# Patient Record
Sex: Female | Born: 1945 | Race: Black or African American | Hispanic: No | Marital: Married | Smoking: Never smoker
Health system: Southern US, Community
[De-identification: ages and names within clinical notes are randomized; demographics above are authoritative.]

## PROBLEM LIST (undated history)

## (undated) DIAGNOSIS — J309 Allergic rhinitis, unspecified: Secondary | ICD-10-CM

## (undated) DIAGNOSIS — J45909 Unspecified asthma, uncomplicated: Secondary | ICD-10-CM

## (undated) DIAGNOSIS — E785 Hyperlipidemia, unspecified: Secondary | ICD-10-CM

## (undated) DIAGNOSIS — I10 Essential (primary) hypertension: Secondary | ICD-10-CM

## (undated) HISTORY — PX: HERNIA REPAIR: SHX51

---

## 2020-05-25 ENCOUNTER — Other Ambulatory Visit: Payer: Self-pay

## 2020-05-25 ENCOUNTER — Other Ambulatory Visit: Payer: Medicare Other

## 2020-05-25 DIAGNOSIS — Z20822 Contact with and (suspected) exposure to covid-19: Secondary | ICD-10-CM

## 2020-05-27 LAB — SARS-COV-2, NAA 2 DAY TAT

## 2020-05-27 LAB — NOVEL CORONAVIRUS, NAA: SARS-CoV-2, NAA: NOT DETECTED

## 2021-02-16 ENCOUNTER — Ambulatory Visit (INDEPENDENT_AMBULATORY_CARE_PROVIDER_SITE_OTHER): Payer: BC Managed Care – PPO

## 2021-02-16 ENCOUNTER — Other Ambulatory Visit: Payer: Self-pay

## 2021-02-16 ENCOUNTER — Encounter: Payer: Self-pay | Admitting: Orthopaedic Surgery

## 2021-02-16 ENCOUNTER — Ambulatory Visit (INDEPENDENT_AMBULATORY_CARE_PROVIDER_SITE_OTHER): Payer: BC Managed Care – PPO | Admitting: Orthopaedic Surgery

## 2021-02-16 DIAGNOSIS — M25511 Pain in right shoulder: Secondary | ICD-10-CM | POA: Insufficient documentation

## 2021-02-16 DIAGNOSIS — G8929 Other chronic pain: Secondary | ICD-10-CM | POA: Diagnosis not present

## 2021-02-16 MED ORDER — BUPIVACAINE HCL 0.25 % IJ SOLN
2.0000 mL | INTRAMUSCULAR | Status: AC | PRN
  Administered 2021-02-16: 2 mL via INTRA_ARTICULAR

## 2021-02-16 MED ORDER — LIDOCAINE HCL 2 % IJ SOLN
2.0000 mL | INTRAMUSCULAR | Status: AC | PRN
  Administered 2021-02-16: 2 mL

## 2021-02-16 NOTE — Progress Notes (Signed)
Office Visit Note   Patient: Misty Hughes           Date of Birth: 1946-05-23           MRN: [id number] Visit Date: 02/16/2021              Requested by: No referring provider defined for this encounter. PCP: No primary care provider on file.   Assessment & Plan: Visit Diagnoses:  1. Chronic right shoulder pain     Plan: Ms. Richens experienced insidious onset of right shoulder pain approximately a month ago.  No history of injury or trauma.  Pain is experience along the proximal arm and not associated with numbness or tingling or any weakness.  She seems to have some difficulty at night and even some trouble reaching overhead.  She is never had any prior problems or prior surgery.  She does have evidence of impingement.  I have given her several choices including a subacromial cortisone injection.  She would like to try that before further diagnostic testing.  Depending upon her response she will let me know over the next 3 to 4 weeks how she did and then we may want to consider an MRI scan as she could easily have a rotator cuff tear  Follow-Up Instructions: Return if symptoms worsen or fail to improve.   Orders:  Orders Placed This Encounter  Procedures   XR Shoulder Right   No orders of the defined types were placed in this encounter.     Procedures: Large Joint Inj: R subacromial bursa on 02/16/2021 12:17 PM Indications: pain and diagnostic evaluation Details: 25 G 1.5 in needle, anterolateral approach  Arthrogram: No  Medications: 2 mL lidocaine 2 %; 2 mL bupivacaine 0.25 %  12 mg betamethasone injected into subacromial space right shoulder with Marcaine and Xylocaine Consent was given by the patient. Immediately prior to procedure a time out was called to verify the correct patient, procedure, equipment, support staff and site/side marked as required. Patient was prepped and draped in the usual sterile fashion.      Clinical Data: No additional  findings.   Subjective: Chief Complaint  Patient presents with   Right Shoulder - Pain  Patient presents today for right shoulder pain. She said that it has been hurting for one month. No known injury. She has pain in her proximal arm. She has no numbness, tingling, or weakness. She said that she has no difficulty with movement of her arm. Her pain is the worst at night when she lays down. She takes Tylenol as needed. She is right hand dominant. No previous treatment or surgery on this shoulder.  HPI  Review of Systems   Objective: Vital Signs: There were no vitals taken for this visit.  Physical Exam Constitutional:      Appearance: She is well-developed.  Eyes:     Pupils: Pupils are equal, round, and reactive to light.  Pulmonary:     Effort: Pulmonary effort is normal.  Skin:    General: Skin is warm and dry.  Neurological:     Mental Status: She is alert and oriented to person, place, and time.  Psychiatric:        Behavior: Behavior normal.    Ortho Exam right shoulder with full overhead motion with minimal discomfort.  There is some pain with impingement testing and with empty can testing.  Negative speeds sign.  Skin intact.  Some tenderness along the anterior subacromial region but no crepitation.  Biceps intact.  Good grip and release  Specialty Comments:  No specialty comments available.  Imaging: XR Shoulder Right  Result Date: 02/16/2021 Films of the right shoulder obtained in several projections.  There may be a very small inferior humeral head spur and some sclerosis about the greater tuberosity.  The joint space is well-maintained.  No ectopic calcification or acute change.  Some bulbous formation of the distal clavicle with AC joint arthritis.  Acromion appears to be flat.  Films may be consistent with some very early arthritis    PMFS History: Patient Active Problem List   Diagnosis Date Noted   Pain in right shoulder 02/16/2021   History reviewed. No  pertinent past medical history.  History reviewed. No pertinent family history.  History reviewed. No pertinent surgical history. Social History   Occupational History   Not on file  Tobacco Use   Smoking status: Not on file   Smokeless tobacco: Not on file  Substance and Sexual Activity   Alcohol use: Not on file   Drug use: Not on file   Sexual activity: Not on file

## 2021-07-20 ENCOUNTER — Ambulatory Visit: Payer: BC Managed Care – PPO

## 2021-07-20 ENCOUNTER — Ambulatory Visit (INDEPENDENT_AMBULATORY_CARE_PROVIDER_SITE_OTHER): Payer: BC Managed Care – PPO | Admitting: Orthopaedic Surgery

## 2021-07-20 ENCOUNTER — Other Ambulatory Visit: Payer: Self-pay

## 2021-07-20 ENCOUNTER — Encounter: Payer: Self-pay | Admitting: Orthopaedic Surgery

## 2021-07-20 VITALS — Ht 61.0 in | Wt 150.0 lb

## 2021-07-20 DIAGNOSIS — G8929 Other chronic pain: Secondary | ICD-10-CM

## 2021-07-20 DIAGNOSIS — M1711 Unilateral primary osteoarthritis, right knee: Secondary | ICD-10-CM

## 2021-07-20 DIAGNOSIS — M25561 Pain in right knee: Secondary | ICD-10-CM

## 2021-07-20 MED ORDER — LIDOCAINE HCL 1 % IJ SOLN
2.0000 mL | INTRAMUSCULAR | Status: AC | PRN
  Administered 2021-07-20: 2 mL

## 2021-07-20 MED ORDER — BUPIVACAINE HCL 0.25 % IJ SOLN
2.0000 mL | INTRAMUSCULAR | Status: AC | PRN
  Administered 2021-07-20: 2 mL via INTRA_ARTICULAR

## 2021-07-20 NOTE — Progress Notes (Signed)
Office Visit Note   Patient: Misty Hughes           Date of Birth: 15-May-1946           MRN: [id number] Visit Date: 07/20/2021              Requested by: No referring provider defined for this encounter. PCP: No primary care provider on file.   Assessment & Plan: Visit Diagnoses:  1. Chronic pain of right knee   2. Unilateral primary osteoarthritis, right knee     Plan: Misty Hughes has end-stage osteoarthritis of her left knee with predominant changes in the lateral compartment.  There is bone-on-bone with large osteophytes and increased valgus.  Long discussion regarding the x-ray findings and her diagnosis.  We also discussed at length different treatment options.  She is not interested in pursuing a knee replacement.  Going to inject the knee with betamethasone and suggest that she use a over-the-counter rub knee support.  She can also still try Voltaren gel or some over-the-counter anti-inflammatory medicines.  Would like to see her back in a month for reevaluation  Follow-Up Instructions: Return if symptoms worsen or fail to improve.   Orders:  Orders Placed This Encounter  Procedures   XR KNEE 3 VIEW RIGHT   No orders of the defined types were placed in this encounter.     Procedures: Large Joint Inj: R knee on 07/20/2021 2:14 PM Indications: pain and diagnostic evaluation Details: 25 G 1.5 in needle, anteromedial approach  Arthrogram: No  Medications: 2 mL lidocaine 1 %; 2 mL bupivacaine 0.25 %  12 mg betamethasone injected with Xylocaine and Marcaine the lateral compartment right knee Procedure, treatment alternatives, risks and benefits explained, specific risks discussed. Consent was given by the patient. Immediately prior to procedure a time out was called to verify the correct patient, procedure, equipment, support staff and site/side marked as required. Patient was prepped and draped in the usual sterile fashion.      Clinical Data: No additional  findings.   Subjective: Chief Complaint  Patient presents with   Right Knee - Pain  Patient presents today for right knee pain. She said that it has hurt for years. No known injury. She said that over time the pain is getting worse. Most of her pain is lateral and anterior. She said that it swells and gives way. No grinding. She does not take anything for pain.   HPI  Review of Systems   Objective: Vital Signs: Ht 5\' 1"  (1.549 m)    Wt 150 lb (68 kg)    BMI 28.34 kg/m   Physical Exam Constitutional:      Appearance: She is well-developed.  Pulmonary:     Effort: Pulmonary effort is normal.  Skin:    General: Skin is warm and dry.  Neurological:     Mental Status: She is alert and oriented to person, place, and time.  Psychiatric:        Behavior: Behavior normal.    Ortho Exam awake alert and oriented x3.  Comfortable sitting.  Right knee with increased valgus particularly with weightbearing.  No obvious effusion.  Does have large knees.  Mostly lateral joint pain but mild.  No pain over the femoral condyle tibial plateau.  No pain medially.  Some patella crepitation but did have full extension and flex to least 105 degrees without instability.  Might have some popliteal fullness but no particular pain  Specialty Comments:  No specialty comments  available.  Imaging: XR KNEE 3 VIEW RIGHT  Result Date: 07/20/2021 Films of the right knee were obtained in 3 projections standing.  There is advanced osteoarthritis with significant changes in the lateral compartment.  There is bone-on-bone and probably 5 to 6 degrees of valgus.  In the lateral compartment is also subchondral sclerosis and peripheral osteophytes.  I did not see any ectopic calcification but did note some osteophytes in the medial compartment and considerably about the patellofemoral joint.  There were large osteophytes more lateral than medial.  Films are consistent with end-stage osteoarthritis without acute change     PMFS History: Patient Active Problem List   Diagnosis Date Noted   Unilateral primary osteoarthritis, right knee 07/20/2021   Pain in right shoulder 02/16/2021   History reviewed. No pertinent past medical history.  History reviewed. No pertinent family history.  History reviewed. No pertinent surgical history. Social History   Occupational History   Not on file  Tobacco Use   Smoking status: Not on file   Smokeless tobacco: Not on file  Substance and Sexual Activity   Alcohol use: Not on file   Drug use: Not on file   Sexual activity: Not on file

## 2021-08-17 ENCOUNTER — Ambulatory Visit (INDEPENDENT_AMBULATORY_CARE_PROVIDER_SITE_OTHER): Payer: Medicare Other | Admitting: Orthopaedic Surgery

## 2021-08-17 ENCOUNTER — Encounter: Payer: Self-pay | Admitting: Orthopaedic Surgery

## 2021-08-17 ENCOUNTER — Other Ambulatory Visit: Payer: Self-pay

## 2021-08-17 VITALS — Ht 61.0 in | Wt 150.0 lb

## 2021-08-17 DIAGNOSIS — M1711 Unilateral primary osteoarthritis, right knee: Secondary | ICD-10-CM

## 2021-08-17 NOTE — Progress Notes (Signed)
Office Visit Note   Patient: Misty Hughes           Date of Birth: Oct 26, 1945           MRN: [id number] Visit Date: 08/17/2021              Requested by: No referring provider defined for this encounter. PCP: No primary care provider on file.   Assessment & Plan: Visit Diagnoses:  1. Unilateral primary osteoarthritis, right knee     Plan: Misty Hughes has end-stage osteoarthritis of her right knee she is done very well with a recent cortisone injection and would like to proceed with viscosupplementation.  We will pre-CERT and have her return in the next several weeks. This patient is diagnosed with osteoarthritis of the knee(s).    Radiographs show evidence of joint space narrowing, osteophytes, subchondral sclerosis and/or subchondral cysts.  This patient has knee pain which interferes with functional and activities of daily living.    This patient has experienced inadequate response, adverse effects and/or intolerance with conservative treatments such as acetaminophen, NSAIDS, topical creams, physical therapy or regular exercise, knee bracing and/or weight loss.   This patient has experienced inadequate response or has a contraindication to intra articular steroid injections for at least 3 months.   This patient is not scheduled to have a total knee replacement within 6 months of starting treatment with viscosupplementation.   Follow-Up Instructions: Return pre cert visco.   Orders:  No orders of the defined types were placed in this encounter.  No orders of the defined types were placed in this encounter.     Procedures: No procedures performed   Clinical Data: No additional findings.   Subjective: Chief Complaint  Patient presents with   Right Knee - Follow-up  Patient presents today for follow up on her right knee. She received a right knee injection on 07/20/2021. She said that it helped. She did start to experience pain two days ago. Bracing helps. She is  not taking anything for pain.  HPI  Review of Systems   Objective: Vital Signs: Ht 5\' 1"  (1.549 m)    Wt 150 lb (68 kg)    BMI 28.34 kg/m   Physical Exam Constitutional:      Appearance: She is well-developed.  Pulmonary:     Effort: Pulmonary effort is normal.  Skin:    General: Skin is warm and dry.  Neurological:     Mental Status: She is alert and oriented to person, place, and time.  Psychiatric:        Behavior: Behavior normal.    Ortho Exam awake alert and oriented x3 comfortable sitting.  May be small effusion right knee.  Lacks just a few degrees to full extension of flexed at least 100 degrees without instability.  Had little bit of medial lateral joint pain but definitely better after the cortisone injection several weeks ago  Specialty Comments:  No specialty comments available.  Imaging: No results found.   PMFS History: Patient Active Problem List   Diagnosis Date Noted   Unilateral primary osteoarthritis, right knee 07/20/2021   Pain in right shoulder 02/16/2021   History reviewed. No pertinent past medical history.  History reviewed. No pertinent family history.  History reviewed. No pertinent surgical history. Social History   Occupational History   Not on file  Tobacco Use   Smoking status: Not on file   Smokeless tobacco: Not on file  Substance and Sexual Activity   Alcohol use:  Not on file   Drug use: Not on file   Sexual activity: Not on file

## 2021-08-31 ENCOUNTER — Ambulatory Visit (INDEPENDENT_AMBULATORY_CARE_PROVIDER_SITE_OTHER): Payer: BC Managed Care – PPO | Admitting: Orthopaedic Surgery

## 2021-08-31 ENCOUNTER — Other Ambulatory Visit: Payer: Self-pay

## 2021-08-31 ENCOUNTER — Encounter: Payer: Self-pay | Admitting: Orthopaedic Surgery

## 2021-08-31 DIAGNOSIS — M1711 Unilateral primary osteoarthritis, right knee: Secondary | ICD-10-CM

## 2021-08-31 MED ORDER — HYLAN G-F 20 16 MG/2ML IX SOSY
16.0000 mg | PREFILLED_SYRINGE | INTRA_ARTICULAR | Status: AC | PRN
  Administered 2021-08-31: 16 mg via INTRA_ARTICULAR

## 2021-08-31 NOTE — Progress Notes (Signed)
° °  Office Visit Note   Patient: Misty Hughes           Date of Birth: 01/07/1946           MRN: [id number] Visit Date: 08/31/2021              Requested by: No referring provider defined for this encounter. PCP: No primary care provider on file.   Assessment & Plan: Visit Diagnoses:  1. Unilateral primary osteoarthritis, right knee     Plan: Misty Hughes had her first Synvisc injection into the medial compartment right knee today without problem.  Will be seen weekly for the next 2 weeks to complete the series of 3  Follow-Up Instructions: Return in about 1 week (around 09/07/2021).   Orders:  No orders of the defined types were placed in this encounter.  No orders of the defined types were placed in this encounter.     Procedures: Large Joint Inj: R knee on 08/31/2021 1:39 PM Indications: pain and joint swelling Details: 25 G 1.5 in needle, anteromedial approach  Arthrogram: No  Medications: 16 mg Hylan 16 MG/2ML Outcome: tolerated well, no immediate complications Procedure, treatment alternatives, risks and benefits explained, specific risks discussed. Consent was given by the patient. Immediately prior to procedure a time out was called to verify the correct patient, procedure, equipment, support staff and site/side marked as required. Patient was prepped and draped in the usual sterile fashion.      Clinical Data: No additional findings.   Subjective: Chief Complaint  Patient presents with   Right Knee - Follow-up    Synvisc  Patient presents today for the first Synvisc injection into her right knee.  HPI  Review of Systems   Objective: Vital Signs: There were no vitals taken for this visit.  Physical Exam  Ortho Exam right knee was not hot red or swollen.  Minimal medial joint pain.  No instability.  Specialty Comments:  No specialty comments available.  Imaging: No results found.   PMFS History: Patient Active Problem List   Diagnosis Date  Noted   Unilateral primary osteoarthritis, right knee 07/20/2021   Pain in right shoulder 02/16/2021   History reviewed. No pertinent past medical history.  History reviewed. No pertinent family history.  History reviewed. No pertinent surgical history. Social History   Occupational History   Not on file  Tobacco Use   Smoking status: Not on file   Smokeless tobacco: Not on file  Substance and Sexual Activity   Alcohol use: Not on file   Drug use: Not on file   Sexual activity: Not on file

## 2021-09-08 ENCOUNTER — Ambulatory Visit (INDEPENDENT_AMBULATORY_CARE_PROVIDER_SITE_OTHER): Payer: BC Managed Care – PPO | Admitting: Orthopaedic Surgery

## 2021-09-08 ENCOUNTER — Other Ambulatory Visit: Payer: Self-pay

## 2021-09-08 VITALS — Ht 61.0 in | Wt 150.0 lb

## 2021-09-08 DIAGNOSIS — M1711 Unilateral primary osteoarthritis, right knee: Secondary | ICD-10-CM

## 2021-09-08 MED ORDER — HYLAN G-F 20 16 MG/2ML IX SOSY
16.0000 mg | PREFILLED_SYRINGE | INTRA_ARTICULAR | Status: AC | PRN
  Administered 2021-09-08: 16 mg via INTRA_ARTICULAR

## 2021-09-08 MED ORDER — LIDOCAINE HCL 1 % IJ SOLN
0.5000 mL | INTRAMUSCULAR | Status: AC | PRN
  Administered 2021-09-08: .5 mL

## 2021-09-08 NOTE — Progress Notes (Signed)
? ?  Office Visit Note ?  ?Patient: Misty Hughes           ?Date of Birth: 06/23/1946           ?MRN: [id number] ?Visit Date: 09/08/2021 ?             ?Requested by: No referring provider defined for this encounter. ?PCP: No primary care provider on file. ? ? ?Assessment & Plan: ?Visit Diagnoses:  ?1. Unilateral primary osteoarthritis, right knee   ? ? ?Plan: Second Synvisc injection performed.  I am out of town next week she will see Dr. Durward Fortes for the third injection of her right knee. ?Follow-Up Instructions: No follow-ups on file.  ? ?Orders:  ?Orders Placed This Encounter  ?Procedures  ? Large Joint Inj  ? ?No orders of the defined types were placed in this encounter. ? ? ? ? Procedures: ?Large Joint Inj: R knee on 09/08/2021 2:21 PM ?Indications: pain and joint swelling ?Details: 22 G 1.5 in needle, anterolateral approach ? ?Arthrogram: No ? ?Medications: 0.5 mL lidocaine 1 %; 16 mg Hylan 16 MG/2ML ?Outcome: tolerated well, no immediate complications ?Procedure, treatment alternatives, risks and benefits explained, specific risks discussed. Consent was given by the patient. Immediately prior to procedure a time out was called to verify the correct patient, procedure, equipment, support staff and site/side marked as required. Patient was prepped and draped in the usual sterile fashion.  ? ? ? ? ?Clinical Data: ?No additional findings. ? ? ?Subjective: ?Chief Complaint  ?Patient presents with  ? Right Knee - Pain  ?  Synvisc injection #2  ? ? ?HPI patient here for right knee second Synvisc injection. ? ?Review of Systems unchanged ? ? ?Objective: ?Vital Signs: Ht 5\' 1"  (1.549 m)   Wt 150 lb (68 kg)   BMI 28.34 kg/m?  ? ?Physical Exam unchanged ? ?Ortho Exam crepitus with knee range of motion.  No abnormality at the previous injection site for first Synvisc injection right knee. ? ?Specialty Comments:  ?No specialty comments available. ? ?Imaging: ?No results found. ? ? ?PMFS History: ?Patient Active Problem  List  ? Diagnosis Date Noted  ? Unilateral primary osteoarthritis, right knee 07/20/2021  ? Pain in right shoulder 02/16/2021  ? ?No past medical history on file.  ?No family history on file.  ?No past surgical history on file. ?Social History  ? ?Occupational History  ? Not on file  ?Tobacco Use  ? Smoking status: Not on file  ? Smokeless tobacco: Not on file  ?Substance and Sexual Activity  ? Alcohol use: Not on file  ? Drug use: Not on file  ? Sexual activity: Not on file  ? ? ? ? ? ? ?

## 2021-09-14 ENCOUNTER — Ambulatory Visit (INDEPENDENT_AMBULATORY_CARE_PROVIDER_SITE_OTHER): Payer: BC Managed Care – PPO | Admitting: Orthopaedic Surgery

## 2021-09-14 ENCOUNTER — Encounter: Payer: Self-pay | Admitting: Orthopaedic Surgery

## 2021-09-14 DIAGNOSIS — M1711 Unilateral primary osteoarthritis, right knee: Secondary | ICD-10-CM | POA: Diagnosis not present

## 2021-09-14 MED ORDER — HYLAN G-F 20 16 MG/2ML IX SOSY
16.0000 mg | PREFILLED_SYRINGE | INTRA_ARTICULAR | Status: AC | PRN
  Administered 2021-09-14: 16 mg via INTRA_ARTICULAR

## 2021-09-14 NOTE — Progress Notes (Signed)
? ?  Office Visit Note ?  ?Patient: Misty Hughes           ?Date of Birth: Nov 22, 1945           ?MRN: [id number] ?Visit Date: 09/14/2021 ?             ?Requested by: No referring provider defined for this encounter. ?PCP: No primary care provider on file. ? ? ?Assessment & Plan: ?Visit Diagnoses:  ?1. Unilateral primary osteoarthritis, right knee   ? ? ?Plan: Third and final Synvisc injection right knee has had an excellent response with the prior 2 injections.  We will plan to see back as needed ? ?Follow-Up Instructions: Return if symptoms worsen or fail to improve.  ? ?Orders:  ?No orders of the defined types were placed in this encounter. ? ?No orders of the defined types were placed in this encounter. ? ? ? ? Procedures: ?Large Joint Inj: L knee on 09/14/2021 1:54 PM ?Indications: pain and joint swelling ?Details: 25 G 1.5 in needle, anteromedial approach ? ?Arthrogram: No ? ?Medications: 16 mg Hylan 16 MG/2ML ?Outcome: tolerated well, no immediate complications ?Procedure, treatment alternatives, risks and benefits explained, specific risks discussed. Consent was given by the patient. Immediately prior to procedure a time out was called to verify the correct patient, procedure, equipment, support staff and site/side marked as required. Patient was prepped and draped in the usual sterile fashion.  ? ? ? ? ?Clinical Data: ?No additional findings. ? ? ?Subjective: ?Chief Complaint  ?Patient presents with  ? Right Knee - Follow-up  ?  Synvisc  ?Patient presents today for the third Synvisc injection in her right knee. ? ?HPI ? ?Review of Systems ? ? ?Objective: ?Vital Signs: There were no vitals taken for this visit. ? ?Physical Exam ? ?Ortho Exam right knee was not hot red warm or swollen and no specific areas of tenderness ? ?Specialty Comments:  ?No specialty comments available. ? ?Imaging: ?No results found. ? ? ?PMFS History: ?Patient Active Problem List  ? Diagnosis Date Noted  ? Unilateral primary  osteoarthritis, right knee 07/20/2021  ? Pain in right shoulder 02/16/2021  ? ?History reviewed. No pertinent past medical history.  ?History reviewed. No pertinent family history.  ?History reviewed. No pertinent surgical history. ?Social History  ? ?Occupational History  ? Not on file  ?Tobacco Use  ? Smoking status: Not on file  ? Smokeless tobacco: Not on file  ?Substance and Sexual Activity  ? Alcohol use: Not on file  ? Drug use: Not on file  ? Sexual activity: Not on file  ? ? ? ? ? ? ?

## 2021-11-20 ENCOUNTER — Observation Stay (HOSPITAL_COMMUNITY)
Admission: EM | Admit: 2021-11-20 | Discharge: 2021-11-21 | Disposition: A | Discharge status: Home / Self Care | Payer: BC Managed Care – PPO | Attending: Family Medicine | Admitting: Family Medicine

## 2021-11-20 ENCOUNTER — Other Ambulatory Visit: Payer: Self-pay

## 2021-11-20 ENCOUNTER — Emergency Department (HOSPITAL_COMMUNITY): Payer: BC Managed Care – PPO

## 2021-11-20 ENCOUNTER — Encounter (HOSPITAL_COMMUNITY): Payer: Self-pay | Admitting: *Deleted

## 2021-11-20 DIAGNOSIS — R2689 Other abnormalities of gait and mobility: Secondary | ICD-10-CM | POA: Diagnosis not present

## 2021-11-20 DIAGNOSIS — R479 Unspecified speech disturbances: Secondary | ICD-10-CM | POA: Diagnosis present

## 2021-11-20 DIAGNOSIS — R471 Dysarthria and anarthria: Secondary | ICD-10-CM

## 2021-11-20 DIAGNOSIS — I10 Essential (primary) hypertension: Secondary | ICD-10-CM | POA: Diagnosis present

## 2021-11-20 DIAGNOSIS — R4701 Aphasia: Secondary | ICD-10-CM | POA: Diagnosis present

## 2021-11-20 DIAGNOSIS — I63512 Cerebral infarction due to unspecified occlusion or stenosis of left middle cerebral artery: Principal | ICD-10-CM | POA: Insufficient documentation

## 2021-11-20 DIAGNOSIS — G459 Transient cerebral ischemic attack, unspecified: Secondary | ICD-10-CM

## 2021-11-20 DIAGNOSIS — I639 Cerebral infarction, unspecified: Secondary | ICD-10-CM | POA: Diagnosis present

## 2021-11-20 DIAGNOSIS — Z20822 Contact with and (suspected) exposure to covid-19: Secondary | ICD-10-CM | POA: Insufficient documentation

## 2021-11-20 HISTORY — DX: Allergic rhinitis, unspecified: J30.9

## 2021-11-20 HISTORY — DX: Essential (primary) hypertension: I10

## 2021-11-20 HISTORY — DX: Hyperlipidemia, unspecified: E78.5

## 2021-11-20 LAB — COMPREHENSIVE METABOLIC PANEL
ALT: 17 U/L (ref 0–44)
AST: 19 U/L (ref 15–41)
Albumin: 4.3 g/dL (ref 3.5–5.0)
Alkaline Phosphatase: 60 U/L (ref 38–126)
Anion gap: 6 (ref 5–15)
BUN: 24 mg/dL — ABNORMAL HIGH (ref 8–23)
CO2: 31 mmol/L (ref 22–32)
Calcium: 9.9 mg/dL (ref 8.9–10.3)
Chloride: 105 mmol/L (ref 98–111)
Creatinine, Ser: 1.02 mg/dL — ABNORMAL HIGH (ref 0.44–1.00)
GFR, Estimated: 57 mL/min — ABNORMAL LOW (ref >60)
Glucose, Bld: 165 mg/dL — ABNORMAL HIGH (ref 70–99)
Potassium: 4.1 mmol/L (ref 3.5–5.1)
Sodium: 142 mmol/L (ref 135–145)
Total Bilirubin: 1 mg/dL (ref 0.3–1.2)
Total Protein: 7.6 g/dL (ref 6.5–8.1)

## 2021-11-20 LAB — CBC
HCT: 35.8 % — ABNORMAL LOW (ref 36.0–46.0)
Hemoglobin: 12.2 g/dL (ref 12.0–15.0)
MCH: 31.4 pg (ref 26.0–34.0)
MCHC: 34.1 g/dL (ref 30.0–36.0)
MCV: 92.3 fL (ref 80.0–100.0)
Platelets: 312 10*3/uL (ref 150–400)
RBC: 3.88 MIL/uL (ref 3.87–5.11)
RDW: 13.4 % (ref 11.5–15.5)
WBC: 5.4 10*3/uL (ref 4.0–10.5)
nRBC: 0 % (ref 0.0–0.2)

## 2021-11-20 LAB — I-STAT CHEM 8, ED
BUN: 24 mg/dL — ABNORMAL HIGH (ref 8–23)
Calcium, Ion: 1.25 mmol/L (ref 1.15–1.40)
Chloride: 103 mmol/L (ref 98–111)
Creatinine, Ser: 0.9 mg/dL (ref 0.44–1.00)
Glucose, Bld: 165 mg/dL — ABNORMAL HIGH (ref 70–99)
HCT: 39 % (ref 36.0–46.0)
Hemoglobin: 13.3 g/dL (ref 12.0–15.0)
Potassium: 4.1 mmol/L (ref 3.5–5.1)
Sodium: 142 mmol/L (ref 135–145)
TCO2: 29 mmol/L (ref 22–32)

## 2021-11-20 LAB — RESP PANEL BY RT-PCR (FLU A&B, COVID) ARPGX2
Influenza A by PCR: NEGATIVE
Influenza B by PCR: NEGATIVE
SARS Coronavirus 2 by RT PCR: NEGATIVE

## 2021-11-20 LAB — PROTIME-INR
INR: 1.1 (ref 0.8–1.2)
Prothrombin Time: 13.7 seconds (ref 11.4–15.2)

## 2021-11-20 LAB — DIFFERENTIAL
Abs Immature Granulocytes: 0.01 10*3/uL (ref 0.00–0.07)
Basophils Absolute: 0 10*3/uL (ref 0.0–0.1)
Basophils Relative: 0 %
Eosinophils Absolute: 0 10*3/uL (ref 0.0–0.5)
Eosinophils Relative: 1 %
Immature Granulocytes: 0 %
Lymphocytes Relative: 17 %
Lymphs Abs: 0.9 10*3/uL (ref 0.7–4.0)
Monocytes Absolute: 0.4 10*3/uL (ref 0.1–1.0)
Monocytes Relative: 7 %
Neutro Abs: 4.1 10*3/uL (ref 1.7–7.7)
Neutrophils Relative %: 75 %

## 2021-11-20 LAB — RAPID URINE DRUG SCREEN, HOSP PERFORMED
Amphetamines: NOT DETECTED
Barbiturates: NOT DETECTED
Benzodiazepines: NOT DETECTED
Cocaine: NOT DETECTED
Opiates: NOT DETECTED
Tetrahydrocannabinol: NOT DETECTED

## 2021-11-20 LAB — URINALYSIS, ROUTINE W REFLEX MICROSCOPIC
Bilirubin Urine: NEGATIVE
Glucose, UA: NEGATIVE mg/dL
Hgb urine dipstick: NEGATIVE
Ketones, ur: NEGATIVE mg/dL
Leukocytes,Ua: NEGATIVE
Nitrite: NEGATIVE
Protein, ur: NEGATIVE mg/dL
Specific Gravity, Urine: 1.03 (ref 1.005–1.030)
pH: 8 (ref 5.0–8.0)

## 2021-11-20 LAB — APTT: aPTT: 31 seconds (ref 24–36)

## 2021-11-20 LAB — ETHANOL: Alcohol, Ethyl (B): <10 mg/dL (ref <10)

## 2021-11-20 MED ORDER — ACETAMINOPHEN 650 MG RE SUPP: 650.0000 mg | RECTAL | Status: DC | PRN

## 2021-11-20 MED ORDER — ACETAMINOPHEN 325 MG PO TABS: 650.0000 mg | ORAL_TABLET | ORAL | Status: DC | PRN

## 2021-11-20 MED ORDER — IOHEXOL 300 MG/ML  SOLN: 75.0000 mL | Freq: Once | INTRAMUSCULAR | Status: DC | PRN

## 2021-11-20 MED ORDER — CLOPIDOGREL BISULFATE 75 MG PO TABS
300.0000 mg | ORAL_TABLET | Freq: Once | ORAL | Status: AC
  Administered 2021-11-20: 300 mg via ORAL
  Filled 2021-11-20: qty 4

## 2021-11-20 MED ORDER — STROKE: EARLY STAGES OF RECOVERY BOOK
Freq: Once | Status: AC
  Filled 2021-11-20: qty 1

## 2021-11-20 MED ORDER — ASPIRIN 81 MG PO CHEW
324.0000 mg | CHEWABLE_TABLET | Freq: Once | ORAL | Status: AC
  Administered 2021-11-20: 324 mg via ORAL
  Filled 2021-11-20: qty 4

## 2021-11-20 MED ORDER — ATORVASTATIN CALCIUM 40 MG PO TABS
40.0000 mg | ORAL_TABLET | Freq: Every day | ORAL | Status: DC
  Administered 2021-11-21: 40 mg via ORAL
  Filled 2021-11-20: qty 1

## 2021-11-20 MED ORDER — CLOPIDOGREL BISULFATE 75 MG PO TABS
75.0000 mg | ORAL_TABLET | Freq: Every day | ORAL | Status: DC
  Administered 2021-11-21: 75 mg via ORAL
  Filled 2021-11-20 (×2): qty 1

## 2021-11-20 MED ORDER — ACETAMINOPHEN 160 MG/5ML PO SOLN: 650.0000 mg | ORAL | Status: DC | PRN

## 2021-11-20 MED ORDER — ASPIRIN EC 81 MG PO TBEC
81.0000 mg | DELAYED_RELEASE_TABLET | Freq: Every day | ORAL | Status: DC
  Administered 2021-11-21: 81 mg via ORAL
  Filled 2021-11-20: qty 1

## 2021-11-20 MED ORDER — SENNOSIDES-DOCUSATE SODIUM 8.6-50 MG PO TABS: 1.0000 | ORAL_TABLET | Freq: Every evening | ORAL | Status: DC | PRN

## 2021-11-20 MED ORDER — IOHEXOL 350 MG/ML SOLN
75.0000 mL | Freq: Once | INTRAVENOUS | Status: AC | PRN
  Administered 2021-11-20: 75 mL via INTRAVENOUS

## 2021-11-20 MED ORDER — LORAZEPAM 2 MG/ML IJ SOLN: 0.5000 mg | Freq: Once | INTRAMUSCULAR | Status: DC

## 2021-11-20 NOTE — ED Notes (Signed)
Ambulatory to restroom with no assist.  ?

## 2021-11-20 NOTE — ED Provider Notes (Signed)
?[place] ?Provider Note ? ? ?CSN: [id number] ?Arrival date & time: 11/20/21  1438 ? ?  ? ?History ? ?Chief Complaint  ?Patient presents with  ? Aphasia  ? ? ?Misty Hughes is a 76 y.o. female. ? ?HPI ?Patient presents 2 episodes today of difficulty speaking.  Reportedly words were right in her head but she could not get them out.  May have been the wrong words but also potentially slurred.  States she was feeling fine besides it.  Reported lasted a little over 1/2-hour the first episode it may be similar amount the second time.  No having fevers.  No headache.  No confusion.  States she was little anxious about the events.  At this time feels back to normal although potentially feels a little generally weak.  Denies history of high blood pressure but also does appear that she may be on Dyazide. ?  ?History reviewed. No pertinent past medical history. ? ?Home Medications ?Prior to Admission medications   ?Medication Sig Start Date End Date Taking? Authorizing Provider  ?triamterene-hydrochlorothiazide (DYAZIDE) 37.5-25 MG capsule Take 1 capsule by mouth daily as needed. 06/20/21   [provider]  ?   ? ?Allergies    ?Patient has no known allergies.   ? ?Review of Systems   ?Review of Systems  ?Constitutional:  Negative for appetite change.  ?Eyes:  Negative for visual disturbance.  ?Respiratory:  Negative for cough.   ?Gastrointestinal:  Negative for abdominal pain.  ?Neurological:  Positive for speech difficulty.  ? ?Physical Exam ?Updated Vital Signs ?BP 126/71   Pulse 87   Temp 98.5 ?F (36.9 ?C) (Oral)   Resp 18   Ht '5\' 1"'$  (1.549 m)   Wt 61.2 kg   SpO2 99%   BMI 25.51 kg/m?  ?Physical Exam ?Vitals and nursing note reviewed.  ?HENT:  ?   Head: Atraumatic.  ?Eyes:  ?   Pupils: Pupils are equal, round, and reactive to light.  ?Abdominal:  ?   Tenderness: There is abdominal tenderness.  ?Musculoskeletal:  ?   Cervical back: Neck supple.  ?Skin: ?   Capillary Refill: Capillary  refill takes less than 2 seconds.  ?Neurological:  ?   Mental Status: She is alert and oriented to person, place, and time.  ?   Comments: Able to ambulate.  Finger-nose intact.  Face symmetric.  Normal speech.  Does appear little anxious.  Eye movements intact but may have a little nystagmus with gaze to left or right.  ? ? ?ED Results / Procedures / Treatments   ?Labs ?(all labs ordered are listed, but only abnormal results are displayed) ?Labs Reviewed  ?CBC - Abnormal; Notable for the following components:  ?    Result Value  ? HCT 35.8 (*)   ? All other components within normal limits  ?COMPREHENSIVE METABOLIC PANEL - Abnormal; Notable for the following components:  ? Glucose, Bld 165 (*)   ? BUN 24 (*)   ? Creatinine, Ser 1.02 (*)   ? GFR, Estimated 57 (*)   ? All other components within normal limits  ?I-STAT CHEM 8, ED - Abnormal; Notable for the following components:  ? BUN 24 (*)   ? Glucose, Bld 165 (*)   ? All other components within normal limits  ?RESP PANEL BY RT-PCR (FLU A&B, COVID) [id number]  ?PROTIME-INR  ?APTT  ?DIFFERENTIAL  ?ETHANOL  ?RAPID URINE DRUG SCREEN, HOSP PERFORMED  ?URINALYSIS, ROUTINE W REFLEX MICROSCOPIC  ? ? ?EKG ?EKG  Interpretation ? ?Date/Time:  Sunday Nov 20 2021 15:58:07 EDT ?Ventricular Rate:  102 ?PR Interval:    ?QRS Duration: 85 ?QT Interval:  351 ?QTC Calculation: 458 ?R Axis:   62 ?Text Interpretation: Junctional tachycardia vs 1st degree block Borderline low voltage, extremity leads Probable anteroseptal infarct, old Confirmed by Davonna Belling [phone]) on 11/20/2021 4:44:56 PM ? ?Radiology ?CT ANGIO HEAD NECK W WO CM ? ?Result Date: 11/20/2021 ?CLINICAL DATA:  Neuro deficit, acute, stroke suspected. Slurred speech, resolved. EXAM: CT ANGIOGRAPHY HEAD AND NECK TECHNIQUE: Multidetector CT imaging of the head and neck was performed using the standard protocol during bolus administration of intravenous contrast. Multiplanar CT image reconstructions and MIPs were obtained to  evaluate the vascular anatomy. Carotid stenosis measurements (when applicable) are obtained utilizing NASCET criteria, using the distal internal carotid diameter as the denominator. RADIATION DOSE REDUCTION: This exam was performed according to the departmental dose-optimization program which includes automated exposure control, adjustment of the mA and/or kV according to patient size and/or use of iterative reconstruction technique. CONTRAST:  75 mL Omnipaque 350 COMPARISON:  None Available. FINDINGS: CT HEAD FINDINGS Brain: There is no evidence of an acute infarct, intracranial hemorrhage, mass, midline shift, or extra-axial fluid collection. There is a small chronic right parietal cortical infarct. Mild cerebral atrophy is within normal limits for age. Vascular: Reported below. Skull: No fracture or suspicious osseous lesion. Sinuses: Visualized paranasal sinuses and mastoid air cells are clear. Orbits: Unremarkable. Review of the MIP images confirms the above findings CTA NECK FINDINGS Aortic arch: Normal variant aortic arch branching pattern with common origin of the brachiocephalic and left common carotid arteries. Widely patent arch vessel origins. Right carotid system: Patent without evidence stenosis or dissection. Retropharyngeal course of the mid cervical ICA. Left carotid system: Patent without evidence of stenosis or dissection. Vertebral arteries: Patent and codominant without evidence of stenosis or dissection. Skeleton: Mild cervicothoracic disc degeneration. Moderately advanced facet arthrosis at C7-T1 with grade 1 anterolisthesis. Bilateral facet ankylosis from T1-T3. Other neck: Subcentimeter thyroid nodules for which no follow-up imaging is recommended. No evidence of cervical lymphadenopathy. Upper chest: Clear lung apices. Review of the MIP images confirms the above findings CTA HEAD FINDINGS Anterior circulation: The internal carotid arteries are patent from skull base to carotid termini with  mild atherosclerotic plaque bilaterally not resulting in significant stenosis. An infundibulum is noted at the right posterior communicating origin. The A1 and M1 segments are widely patent bilaterally. There is a new proximal to mid left M2 branch occlusion with minimal distal reconstitution. No aneurysm is identified. Posterior circulation: The intracranial vertebral arteries are widely patent to the basilar. Patent right PICA, left AICA, and bilateral SCA origins are identified. The basilar artery is widely patent. There are small right and large left posterior communicating arteries with hypoplasia of the left P1 segment. Both PCAs are patent without evidence of a significant proximal stenosis. No aneurysm is identified. Venous sinuses: Patent. Anatomic variants: Predominantly fetal origin of the left PCA. Review of the MIP images confirms the above findings IMPRESSION: 1. 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 No visible acute infarct or hemorrhage on noncontrast CT. 2. Widely patent cervical carotid and vertebral arteries. 3. Small chronic right parietal cortical infarct. These results were called by telephone at the time of interpretation on 11/20/2021 at 5:03 pm to Dr. Davonna Belling, who verbally acknowledged these results. Electronically Signed   By: Logan Bores M.D.   On: 11/20/2021 17:04   ? ?Procedures ?Procedures  ? ? ?  Medications Ordered in ED ?Medications  ?aspirin chewable tablet 324 mg (has no administration in time range)  ?clopidogrel (PLAVIX) tablet 300 mg (has no administration in time range)  ?iohexol (OMNIPAQUE) 350 MG/ML injection 75 mL (75 mLs Intravenous Contrast Given 11/20/21 1637)  ? ? ?ED Course/ Medical Decision Making/ A&P ?  ?  ABCD2 Score: 3                     ?Medical Decision Making ?Amount and/or Complexity of Data Reviewed ?Labs: ordered. ?Radiology: ordered. ? ?Risk ?OTC drugs. ?Prescription drug management. ? ? ?Patient presented with difficulty speaking.  Had 2 separate  episodes today eachLasting over half hour.  Back to baseline now.  Will reset the clock in case new deficits develop. ? ?Patient still feeling about the same.  Potentially occasionally has a little difficulty wo

## 2021-11-20 NOTE — H&P (Signed)
?History and Physical  ? ? ?Misty Hughes INO:[id number] DOB: 1946/07/04 DOA: 11/20/2021 ? ?PCP: Pcp, No  ? ?Patient coming from: Home ? ?I have personally briefly reviewed patient's old medical records in [place] ? ?Chief Complaint: Speech difficulty  ? ?HPI: Misty Hughes is a 76 y.o. female with no known medical history presented to the ED with complaints of slurred speech.  Patient had 2 episodes of this today.  First episode was about 10:00 in the morning, last episode was about 1 PM today.  Patient was last seen normal at about 6 AM this morning. Patient reports she had towards in her head but she could not get them out, and when she got them out the words were slurred.  Patient's daughter who is not currently at bedside had the slurred speech.  This lasted about half an hour and resolved.  The second episode lasted about the same. ?Spouse and patient's daughterShon Hale at bedside. ?She also reports today at about 1:00, patient was trying to hold her utensils with her right hand but was unable to.  At this time all of the symptoms have mostly resolved.  She is having some occasional delay voicing some words during my evaluation.  Patient's daughter [name] states this is not normal. ? ?Patient is not on any medications. ? ?ED Course: Stable vitals.  Head CT-without acute abnormality.  CTA neck without acute abnormality.  CTA head shows proximal to mid left M2 branch occlusion.  No visible acute infarct or hemorrhage. ? ?[place] neurology consulted, recommended admission to [facility], CTA head findings noted, per notes based on patient's minimal symptoms, she does not meet criteria for intra-arterial thrombectomy.  Obtain CT perfusion if change in mental status. ?If patient remains at [facility] due to bed unavailability, work-up can be completed here, neurology here to follow. ? ?Review of Systems: As per HPI all other systems reviewed and negative. ? ?History reviewed. No pertinent past medical  history. ? ?History reviewed. No pertinent surgical history. ? ? reports that she has never smoked. She has never used smokeless tobacco. She reports that she does not drink alcohol and does not use drugs. ? ?No Known Allergies ? ?Family history of hypertension. ? ?Prior to Admission medications   ?Medication Sig Start Date End Date Taking? Authorizing Provider  ?triamterene-hydrochlorothiazide (DYAZIDE) 37.5-25 MG capsule Take 1 capsule by mouth daily as needed. 06/20/21   [provider]  ? ? ?Physical Exam: ?Vitals:  ? 11/20/21 1456 11/20/21 1600 11/20/21 1630 11/20/21 1700  ?BP: 128/68 118/64 126/71 129/65  ?Pulse: (!) 103 95 87 100  ?Resp: 17 (!) '25 18 13  '$ ?Temp: 98.5 ?F (36.9 ?C)     ?TempSrc: Oral     ?SpO2: 99% 99% 99% 100%  ?Weight: 61.2 kg     ?Height: '5\' 1"'$  (1.549 m)     ? ? ?Constitutional: NAD, calm, comfortable ?Vitals:  ? 11/20/21 1456 11/20/21 1600 11/20/21 1630 11/20/21 1700  ?BP: 128/68 118/64 126/71 129/65  ?Pulse: (!) 103 95 87 100  ?Resp: 17 (!) '25 18 13  '$ ?Temp: 98.5 ?F (36.9 ?C)     ?TempSrc: Oral     ?SpO2: 99% 99% 99% 100%  ?Weight: 61.2 kg     ?Height: '5\' 1"'$  (1.549 m)     ? ?Eyes: PERRL, lids and conjunctivae normal ?ENMT: Mucous membranes are moist.   ?Neck: normal, supple, no masses, no thyromegaly ?Respiratory: clear to auscultation bilaterally, no wheezing, no crackles. Normal respiratory effort. No  accessory muscle use.  ?Cardiovascular: Regular rate and rhythm, no murmurs / rubs / gallops. No extremity edema. 2+ pedal pulses. No carotid bruits.  ?Abdomen: no tenderness, no masses palpated. No hepatosplenomegaly. Bowel sounds positive.  ?Musculoskeletal: no clubbing / cyanosis. No joint deformity upper and lower extremities. Good ROM, no contractures. Normal muscle tone.  ?Skin: no rashes, lesions, ulcers. No induration ?Neurologic:  ?Neurological:  ?   Mental Status: She is alert.  ?   GCS: GCS eye subscore is 4. GCS verbal subscore is 5. GCS motor subscore is 6.  ?    Comments: Mental Status:  ?Alert, oriented, thought content appropriate, able to give a coherent history. Speech fluent with occasional delay in verbalizing some words. Able to follow 2 step commands without difficulty. ?Cranial Nerves:  ?II:  Peripheral visual fields grossly normal, pupils equal, round, reactive to light ?III,IV, VI: ptosis not present, extra-ocular motions intact bilaterally  ?V,VII: smile symmetric, eyebrows raise symmetric, facial light touch sensation equal ?VIII: hearing grossly normal to voice  ?X:   ?XI: bilateral shoulder shrug symmetric and strong ?XII: midline tongue extension without fassiculations ?Motor:  ?Normal tone.  5/5 strength in bilateral upper and lower extremities ?Sensory: Sensation intact to light touch in all extremities.  ?Cerebellar:   ?CV: distal pulses palpable throughout  ?  ?Psychiatric: Normal judgment and insight. Alert and oriented x 3. Normal mood.  ? ?Labs on Admission: I have personally reviewed following labs and imaging studies ? ?CBC: ?Recent Labs  ?Lab 11/20/21 ?1515 11/20/21 ?1548  ?WBC 5.4  --   ?NEUTROABS 4.1  --   ?HGB 12.2 13.3  ?HCT 35.8* 39.0  ?MCV 92.3  --   ?PLT 312  --   ? ?Basic Metabolic Panel: ?Recent Labs  ?Lab 11/20/21 ?1515 11/20/21 ?1548  ?NA 142 142  ?K 4.1 4.1  ?CL 105 103  ?CO2 31  --   ?GLUCOSE 165* 165*  ?BUN 24* 24*  ?CREATININE 1.02* 0.90  ?CALCIUM 9.9  --   ? ?Liver Function Tests: ?Recent Labs  ?Lab 11/20/21 ?1515  ?AST 19  ?ALT 17  ?ALKPHOS 60  ?BILITOT 1.0  ?PROT 7.6  ?ALBUMIN 4.3  ? ?Coagulation Profile: ?Recent Labs  ?Lab 11/20/21 ?1515  ?INR 1.1  ? ? ?Radiological Exams on Admission: ?CT ANGIO HEAD NECK W WO CM ? ?Result Date: 11/20/2021 ?CLINICAL DATA:  Neuro deficit, acute, stroke suspected. Slurred speech, resolved. EXAM: CT ANGIOGRAPHY HEAD AND NECK TECHNIQUE: Multidetector CT imaging of the head and neck was performed using the standard protocol during bolus administration of intravenous contrast. Multiplanar CT image  reconstructions and MIPs were obtained to evaluate the vascular anatomy. Carotid stenosis measurements (when applicable) are obtained utilizing NASCET criteria, using the distal internal carotid diameter as the denominator. RADIATION DOSE REDUCTION: This exam was performed according to the departmental dose-optimization program which includes automated exposure control, adjustment of the mA and/or kV according to patient size and/or use of iterative reconstruction technique. CONTRAST:  75 mL Omnipaque 350 COMPARISON:  None Available. FINDINGS: CT HEAD FINDINGS Brain: There is no evidence of an acute infarct, intracranial hemorrhage, mass, midline shift, or extra-axial fluid collection. There is a small chronic right parietal cortical infarct. Mild cerebral atrophy is within normal limits for age. Vascular: Reported below. Skull: No fracture or suspicious osseous lesion. Sinuses: Visualized paranasal sinuses and mastoid air cells are clear. Orbits: Unremarkable. Review of the MIP images confirms the above findings CTA NECK FINDINGS Aortic arch: Normal variant aortic arch branching  pattern with common origin of the brachiocephalic and left common carotid arteries. Widely patent arch vessel origins. Right carotid system: Patent without evidence stenosis or dissection. Retropharyngeal course of the mid cervical ICA. Left carotid system: Patent without evidence of stenosis or dissection. Vertebral arteries: Patent and codominant without evidence of stenosis or dissection. Skeleton: Mild cervicothoracic disc degeneration. Moderately advanced facet arthrosis at C7-T1 with grade 1 anterolisthesis. Bilateral facet ankylosis from T1-T3. Other neck: Subcentimeter thyroid nodules for which no follow-up imaging is recommended. No evidence of cervical lymphadenopathy. Upper chest: Clear lung apices. Review of the MIP images confirms the above findings CTA HEAD FINDINGS Anterior circulation: The internal carotid arteries are  patent from skull base to carotid termini with mild atherosclerotic plaque bilaterally not resulting in significant stenosis. An infundibulum is noted at the right posterior communicating origin. The A1 and M1 segments are w

## 2021-11-20 NOTE — Assessment & Plan Note (Addendum)
RESOLVED NOW.  BACK TO BASELINE.  ?Secondary to acute stroke  ?Speech abnormality, and transient weakness of right hand.  Head CT without acute abnormality, CTA head and neck obtained, showing proximal to mid left M2 branch occlusion.  She denies prior strokes. ?-Aspirin 325 x1 continue 81 mg daily, and Plavix 300 mg load, continue 75 mg daily for at least 90-day course. ?-MRI brain with findings of two small acute infarcts of the left MCA territory. No hemorrhage. Chronic cortical/subcortical infarcts of the right greater than left parietal lobes and bilateral frontal lobes ?-PT/OT speech therapy eval - NO FURTHER FOLLOW UP RECOMMENDED ?-Started atorvastatin 40 mg daily ? ?

## 2021-11-20 NOTE — Progress Notes (Signed)
Patient ID: Misty Hughes, female   DOB: Oct 15, 1945, 76 y.o.   MRN: [id number] ? ?Curbside, patient discussed with Dr. Alvino Chapel.  76 year old woman with minimal comorbidities and not on any significant medications outpatient.  ? ?Currently her NIH is 0 and she is essentially back to baseline other than some shuffling gait, but she has had some stuttering aphasia today, no report of weakness.   ?CTA is notable for the left MCA branch occlusion.   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 ? ?Current vital signs: ?BP 126/71   Pulse 87   Temp 98.5 ?F (36.9 ?C) (Oral)   Resp 18   Ht '5\' 1"'$  (1.549 m)   Wt 61.2 kg   SpO2 99%   BMI 25.51 kg/m?  ?Vital signs in last 24 hours: ?Temp:  [98.5 ?F (36.9 ?C)] 98.5 ?F (36.9 ?C) (05/14 1456) ?Pulse Rate:  [87-103] 87 (05/14 1630) ?Resp:  [17-25] 18 (05/14 1630) ?BP: (118-128)/(64-71) 126/71 (05/14 1630) ?SpO2:  [99 %] 99 % (05/14 1630) ?Weight:  [61.2 kg] 61.2 kg (05/14 1456) ? ? ?Recommendations: ?- She should be admitted for stroke work-up ?- Close neurological monitoring ?- Stat head CT and CT perfusion for any change in mental status ?- HgbA1c, fasting lipid panel, UA, UDS ?- MRI brain  ?- Echocardiogram ?- Aspirin - dose '325mg'$  followed by 81 mg daily ?- Plavix 300 mg load with 75 mg daily for at least 90 day course  ?- Risk factor modification ?- Telemetry monitoring ?- Blood pressure goal  ? - Permissive hypertension to 220/120  ?- PT consult, OT consult, Speech consult, unless patient is back to baseline ?- Dr. Hortense Ramal to follow unless patient declines and is transferred to [facility] for thrombectomy ? ?Lesleigh Noe MD-PhD ?[facility] ?[phone]  ? ?

## 2021-11-20 NOTE — ED Triage Notes (Addendum)
Pt with slurred speech around 1000 this morning.  Unable to state exact time. Husband seen pt normal at 0600.  Pt states her speech is normal currently.  No arm or leg drift noted in triage.  ?

## 2021-11-21 ENCOUNTER — Observation Stay (HOSPITAL_COMMUNITY): Payer: BC Managed Care – PPO

## 2021-11-21 ENCOUNTER — Other Ambulatory Visit: Payer: Self-pay | Admitting: *Deleted

## 2021-11-21 ENCOUNTER — Encounter (HOSPITAL_COMMUNITY): Payer: Self-pay | Admitting: Family Medicine

## 2021-11-21 ENCOUNTER — Encounter: Payer: Self-pay | Admitting: *Deleted

## 2021-11-21 ENCOUNTER — Observation Stay (HOSPITAL_BASED_OUTPATIENT_CLINIC_OR_DEPARTMENT_OTHER): Payer: BC Managed Care – PPO

## 2021-11-21 DIAGNOSIS — I6389 Other cerebral infarction: Secondary | ICD-10-CM | POA: Diagnosis not present

## 2021-11-21 DIAGNOSIS — I471 Supraventricular tachycardia: Secondary | ICD-10-CM

## 2021-11-21 DIAGNOSIS — I10 Essential (primary) hypertension: Secondary | ICD-10-CM | POA: Diagnosis not present

## 2021-11-21 DIAGNOSIS — I639 Cerebral infarction, unspecified: Secondary | ICD-10-CM | POA: Diagnosis not present

## 2021-11-21 DIAGNOSIS — I63512 Cerebral infarction due to unspecified occlusion or stenosis of left middle cerebral artery: Secondary | ICD-10-CM | POA: Diagnosis not present

## 2021-11-21 DIAGNOSIS — R471 Dysarthria and anarthria: Secondary | ICD-10-CM | POA: Diagnosis not present

## 2021-11-21 DIAGNOSIS — I4891 Unspecified atrial fibrillation: Secondary | ICD-10-CM

## 2021-11-21 HISTORY — DX: Cerebral infarction, unspecified: I63.9

## 2021-11-21 LAB — LIPID PANEL
Cholesterol: 147 mg/dL (ref 0–200)
HDL: 56 mg/dL (ref >40)
LDL Cholesterol: 86 mg/dL (ref 0–99)
Total CHOL/HDL Ratio: 2.6 RATIO
Triglycerides: 23 mg/dL (ref <150)
VLDL: 5 mg/dL (ref 0–40)

## 2021-11-21 LAB — ECHOCARDIOGRAM COMPLETE
AR max vel: 1.64 cm2
AV Area VTI: 1.75 cm2
AV Area mean vel: 1.55 cm2
AV Mean grad: 2.5 mmHg
AV Peak grad: 5 mmHg
Ao pk vel: 1.12 m/s
Height: 61 in
S' Lateral: 2.8 cm
Weight: 2160 oz

## 2021-11-21 LAB — HEMOGLOBIN A1C
Hgb A1c MFr Bld: 5 % (ref 4.8–5.6)
Mean Plasma Glucose: 97 mg/dL

## 2021-11-21 MED ORDER — PANTOPRAZOLE SODIUM 20 MG PO TBEC: 20.0000 mg | DELAYED_RELEASE_TABLET | Freq: Every day | ORAL | 2 refills | Status: DC

## 2021-11-21 MED ORDER — SODIUM CHLORIDE 0.9 % IV SOLN: INTRAVENOUS | Status: DC

## 2021-11-21 MED ORDER — ATORVASTATIN CALCIUM 40 MG PO TABS: 40.0000 mg | ORAL_TABLET | Freq: Every evening | ORAL | 1 refills | Status: DC

## 2021-11-21 MED ORDER — FEXOFENADINE HCL 180 MG PO TABS: 180.0000 mg | ORAL_TABLET | Freq: Every day | ORAL | Status: AC | PRN

## 2021-11-21 MED ORDER — TRIAMTERENE-HCTZ 37.5-25 MG PO CAPS: 1.0000 | ORAL_CAPSULE | Freq: Every day | ORAL | Status: DC

## 2021-11-21 MED ORDER — ASPIRIN 81 MG PO TBEC: 81.0000 mg | DELAYED_RELEASE_TABLET | Freq: Every day | ORAL | Status: DC

## 2021-11-21 MED ORDER — CLOPIDOGREL BISULFATE 75 MG PO TABS: 75.0000 mg | ORAL_TABLET | Freq: Every day | ORAL | 0 refills | Status: DC

## 2021-11-21 NOTE — Progress Notes (Signed)
?PROGRESS NOTE ? ? ?Misty Hughes  SWF:[id number] DOB: 12-Jun-1946 DOA: 11/20/2021 ?PCP: Pcp, No  ? ?Chief Complaint  ?Patient presents with  ? Aphasia  ? ?Level of care: Telemetry ? ?Brief Admission History:  ?76 y.o. female with no known medical history presented to the ED with complaints of slurred speech.  Patient had 2 episodes of this today.  First episode was about 10:00 in the morning, last episode was about 1 PM today.  Patient was last seen normal at about 6 AM this morning. Patient reports she had towards in her head but she could not get them out, and when she got them out the words were slurred.  Patient's daughter who is not currently at bedside had the slurred speech.  This lasted about half an hour and resolved.  The second episode lasted about the same.  Spouse and patient's daughterShon Hale at bedside.  She also reports today at about 1:00, patient was trying to hold her utensils with her right hand but was unable to.  At this time all of the symptoms have mostly resolved.  She is having some occasional delay voicing some words during my evaluation.  Patient's daughter [name] states this is not normal. ?  ?Patient is not on any medications. ?  ?ED Course: Stable vitals.  Head CT-without acute abnormality.  CTA neck without acute abnormality.  CTA head shows proximal to mid left M2 branch occlusion.  No visible acute infarct or hemorrhage. ?  ?TELE-neurology consulted, CTA head findings noted, per notes based on patient's minimal symptoms, she does not meet criteria for intra-arterial thrombectomy.  Obtain CT perfusion if change in mental status.  If patient remains at [facility] due to bed unavailability, work-up can be completed here, neurology here to follow.   ? ?11/21/2021: tele-neurology consultation with Dr. Hortense Ramal.   ?  ?Assessment and Plan: ?* Acute CVA (cerebrovascular accident) ([place]) ?-- pt presented with two small acute infarcts of the left MCA territory. No hemorrhage. Chronic  cortical/subcortical infarcts of the right greater than left parietal lobes and bilateral frontal lobes.  ?-- appreciate neurologist recommendations: continue aspirin/plavix x 3 months followed by aspirin 81 mg daily ?-- atorvastatin 40 mg daily  ?-- allowing permissive hypertension for first 24 hours, tomorrow can work on lowering blood pressure to goal ?-- complete full stroke work up with TTE, PT/OT eval.  ?-- ambulatory referral to neurology on discharge ?-- per neurology if TTE does not show thrombus would recommend a 30 day event monitor to look for paroxysmal atrial fibrillation  ? ?Essential hypertension ?-- allow permissive hypertension in setting of acute CVA ?-- work on improving to normal blood pressures starting tomorrow  ? ?Speech abnormality ?Secondary to acute stroke  ?Speech abnormality, and transient weakness of right hand.  Most of the symptoms have resolved, except for occasional delay in verbalizing some words.  Head CT without acute abnormality, CTA head and neck obtained, showing proximal to mid left M2 branch occlusion.  She denies prior strokes. ?-Evaluated by telemetry neurology, admit to [facility] CT perfusion for any change in mental status. ?-Allow for permissive hypertension, IV labetalol 10 mg for blood pressure greater than 220/120.  ?- HgbA1c, lipid panel ?-Echocardiogram ?-Aspirin 325 x1 continue 81 mg daily, and Plavix 300 mg load, continue 75 mg daily for at least 90-day course. ?-MRI brain with findings of two small acute infarcts of the left MCA territory. No hemorrhage. Chronic cortical/subcortical infarcts of the right greater than left parietal lobes and  bilateral frontal lobes ?-PT/OT speech therapy eval ?-Started atorvastatin 40 mg daily ? ? ?DVT prophylaxis: SCDs ?Code Status: full  ?Family Communication: husband updated at bedside 5/15 ?Disposition: Status is: Observation ?The patient remains OBS appropriate and will d/c before 2 midnights. ?  ?Consultants:   ?Neurology  ?Procedures:  ? ?Antimicrobials:  ?  ?Subjective: ?Pt says she feels just fine today.  Her speech seems back to normal.  ?Objective: ?Vitals:  ? 11/21/21 0515 11/21/21 0600 11/21/21 0828 11/21/21 0900  ?BP:  130/79 (!) 143/78 (!) 153/89  ?Pulse: 78 76 87 86  ?Resp: '16 20 18 18  '$ ?Temp:   98.7 ?F (37.1 ?C) 98.2 ?F (36.8 ?C)  ?TempSrc:    Oral  ?SpO2: 100% 100% 100% 100%  ?Weight:      ?Height:      ? ? ?Intake/Output Summary (Last 24 hours) at 11/21/2021 1116 ?Last data filed at 11/21/2021 0900 ?Gross per 24 hour  ?Intake 480 ml  ?Output --  ?Net 480 ml  ? ?Filed Weights  ? 11/20/21 1456  ?Weight: 61.2 kg  ? ?Examination: ? ?General exam: Appears calm and comfortable.   ?Respiratory system: Clear to auscultation. Respiratory effort normal. ?Cardiovascular system: normal S1 & S2 heard. No JVD, murmurs, rubs, gallops or clicks. No pedal edema. ?Gastrointestinal system: Abdomen is nondistended, soft and nontender. No organomegaly or masses felt. Normal bowel sounds heard. ?Central nervous system: Alert and oriented. No gross neurological deficits.  ?Extremities: Symmetric 5 x 5 power. ?Skin: No rashes, lesions or ulcers. ?Psychiatry: Judgement and insight appear normal. Mood & affect appropriate.  ? ?Data Reviewed: I have personally reviewed following labs and imaging studies ? ?CBC: ?Recent Labs  ?Lab 11/20/21 ?1515 11/20/21 ?1548  ?WBC 5.4  --   ?NEUTROABS 4.1  --   ?HGB 12.2 13.3  ?HCT 35.8* 39.0  ?MCV 92.3  --   ?PLT 312  --   ? ? ?Basic Metabolic Panel: ?Recent Labs  ?Lab 11/20/21 ?1515 11/20/21 ?1548  ?NA 142 142  ?K 4.1 4.1  ?CL 105 103  ?CO2 31  --   ?GLUCOSE 165* 165*  ?BUN 24* 24*  ?CREATININE 1.02* 0.90  ?CALCIUM 9.9  --   ? ? ?CBG: ?No results for input(s): GLUCAP in the last 168 hours. ? ?Recent Results (from the past 240 hour(s))  ?Resp Panel by RT-PCR (Flu A&B, Covid) Nasopharyngeal Swab     Status: None  ? Collection Time: 11/20/21  3:15 PM  ? Specimen: Nasopharyngeal Swab; Nasopharyngeal(NP)  swabs in vial transport medium  ?Result Value Ref Range Status  ? SARS Coronavirus 2 by RT PCR NEGATIVE NEGATIVE Final  ?  Comment: (NOTE) ?SARS-CoV-2 target nucleic acids are NOT DETECTED. ? ?The SARS-CoV-2 RNA is generally detectable in upper respiratory ?specimens during the acute phase of infection. The lowest ?concentration of SARS-CoV-2 viral copies this assay can detect is ?138 copies/mL. A negative result does not preclude SARS-Cov-2 ?infection and should not be used as the sole basis for treatment or ?other patient management decisions. A negative result may occur with  ?improper specimen collection/handling, submission of specimen other ?than nasopharyngeal swab, presence of viral mutation(s) within the ?areas targeted by this assay, and inadequate number of viral ?copies(<138 copies/mL). A negative result must be combined with ?clinical observations, patient history, and epidemiological ?information. The expected result is Negative. ? ?Fact Sheet for Patients:  ?EntrepreneurPulse.com.au ? ?Fact Sheet for Healthcare Providers:  ?IncredibleEmployment.be ? ?This test is no t yet approved or cleared by the  [place] [employer] and  ?has been authorized for detection and/or diagnosis of SARS-CoV-2 by ?FDA under an Emergency Use Authorization (EUA). This EUA will remain  ?in effect (meaning this test can be used) for the duration of the ?COVID-19 declaration under Section 564(b)(1) of the Act, 21 ?U.S.C.section 360bbb-3(b)(1), unless the authorization is terminated  ?or revoked sooner.  ? ? ?  ? Influenza A by PCR NEGATIVE NEGATIVE Final  ? Influenza B by PCR NEGATIVE NEGATIVE Final  ?  Comment: (NOTE) ?The Xpert Xpress SARS-CoV-2/FLU/RSV plus assay is intended as an aid ?in the diagnosis of influenza from Nasopharyngeal swab specimens and ?should not be used as a sole basis for treatment. Nasal washings and ?aspirates are unacceptable for Xpert Xpress  SARS-CoV-2/FLU/RSV ?testing. ? ?Fact Sheet for Patients: ?EntrepreneurPulse.com.au ? ?Fact Sheet for Healthcare Providers: ?IncredibleEmployment.be ? ?This test is not yet approved or cleared by the [place]

## 2021-11-21 NOTE — Discharge Summary (Signed)
Physician Discharge Summary  ?Misty Hughes NKN:[id number] DOB: October 24, 1945 DOA: 11/20/2021 ? ?Admit date: 11/20/2021 ?Discharge date: 11/21/2021 ? ?Admitted From:  Home  ?Disposition: Home  ? ?Recommendations for Outpatient Follow-up:  ?Follow up with PCP in 1 weeks for blood pressure and blood sugar check  ?Follow up with neurologist Dr. Merlene Laughter in 3 months ?Please take aspirin 81 mg and plavix 75 mg daily for 3 months, then stop plavix but continue aspirin daily ?Please have your cholesterol checked in 3 months  ?Please treat blood pressure to goal of normalizing blood pressure over next week  ?Please follow up on the following pending results: hemoglobin A1c  ? ?Discharge Condition: Stable   ?CODE STATUS: Full  ?Diet: Heart Healthy   ? ?Brief Hospitalization Summary: ?Please see all hospital notes, images, labs for full details of the hospitalization. ?76 y.o. female with no known medical history presented to the ED with complaints of slurred speech.  Patient had 2 episodes of this today.  First episode was about 10:00 in the morning, last episode was about 1 PM today.  Patient was last seen normal at about 6 AM this morning. Patient reports she had towards in her head but she could not get them out, and when she got them out the words were slurred.  Patient's daughter who is not currently at bedside had the slurred speech.  This lasted about half an hour and resolved.  The second episode lasted about the same.  Spouse and patient's daughterShon Hughes at bedside.  She also reports today at about 1:00, patient was trying to hold her utensils with her right hand but was unable to.  At this time all of the symptoms have mostly resolved.  She is having some occasional delay voicing some words during my evaluation.  Patient's daughter Misty Hughes states this is not normal. ?  ?Patient is not on any medications. ?  ?ED Course: Stable vitals.  Head CT-without acute abnormality.  CTA neck without acute abnormality.  CTA head  shows proximal to mid left M2 branch occlusion.  No visible acute infarct or hemorrhage. ?  ?TELE-neurology consulted, CTA head findings noted, per notes based on patient's minimal symptoms, she does not meet criteria for intra-arterial thrombectomy.  Obtain CT perfusion if change in mental status.  If patient remains at [facility] due to bed unavailability, work-up can be completed here, neurology here to follow.   ? ?11/21/2021: tele-neurology consultation with Dr. Hortense Ramal.   ? ?Hospital Course by problem list ? ?Assessment and Plan: ?* Acute CVA (cerebrovascular accident) ([place]) ?-- pt presented with two small acute infarcts of the left MCA territory. No hemorrhage. Chronic cortical/subcortical infarcts of the right greater than left parietal lobes and bilateral frontal lobes.  ?-- appreciate neurologist recommendations: continue aspirin/plavix x 3 months followed by aspirin 81 mg daily ?-- atorvastatin 40 mg daily  ?-- allowing permissive hypertension for first 24 hours, tomorrow can work on lowering blood pressure to goal ?-- complete full stroke work up with TTE, PT/OT eval.  ?-- ambulatory referral to neurology on discharge ?-- per neurology if TTE does not show thrombus would recommend a 30 day event monitor to look for paroxysmal atrial fibrillation  ?-- I called [facility] and spoke with Lollie Marrow and made arrangements for a 30 day monitor. She said that [employer] will call patient and mail her the monitor to her home.   ?-- TTE : LVEF 55-60% with indeterminate diastolic parameters. There is a small patent foramen ovale with predominantly  left to right shunting across the atrial septum.  I spoke with Dr. Hortense Ramal about this findings and she recommended further work up with lower ext venous doppler study that came back negative but no other work up after that.    ?-- ambulatory referral for neurology in 3 months with Dr. Merlene Laughter ? ?Essential hypertension ?-- allow permissive hypertension in setting of  acute CVA ?-- work on improving to normal blood pressures starting tomorrow  ?-- pt advised to resume her home BP meds tomorrow and to see her PCP in 1 week to have BP checked again and further titrate / adjust therapy as needed.  ? ?Speech abnormality ?RESOLVED NOW.  BACK TO BASELINE.  ?Secondary to acute stroke  ?Speech abnormality, and transient weakness of right hand.  Head CT without acute abnormality, CTA head and neck obtained, showing proximal to mid left M2 branch occlusion.  She denies prior strokes. ?-Aspirin 325 x1 continue 81 mg daily, and Plavix 300 mg load, continue 75 mg daily for at least 90-day course. ?-MRI brain with findings of two small acute infarcts of the left MCA territory. No hemorrhage. Chronic cortical/subcortical infarcts of the right greater than left parietal lobes and bilateral frontal lobes ?-PT/OT speech therapy eval - NO FURTHER FOLLOW UP RECOMMENDED ?-Started atorvastatin 40 mg daily ? ? ? ?Discharge Diagnoses:  ?Principal Problem: ?  Acute CVA (cerebrovascular accident) ([place]) ?Active Problems: ?  Speech abnormality ?  Essential hypertension ? ? ?Discharge Instructions: ?Discharge Instructions   ? ? Ambulatory referral to Neurology   Complete by: As directed ?  ? ?  ? ?Allergies as of 11/21/2021   ?No Known Allergies ?  ? ?  ?Medication List  ?  ? ?TAKE these medications   ? ?aspirin 81 MG EC tablet ?Take 1 tablet (81 mg total) by mouth daily. Swallow whole. ?Start taking on: Nov 22, 2021 ?  ?atorvastatin 40 MG tablet ?Commonly known as: LIPITOR ?Take 1 tablet (40 mg total) by mouth every evening. ?  ?clopidogrel 75 MG tablet ?Commonly known as: PLAVIX ?Take 1 tablet (75 mg total) by mouth daily. ?Start taking on: Nov 22, 2021 ?  ?fexofenadine 180 MG tablet ?Commonly known as: ALLEGRA ?Take 1 tablet (180 mg total) by mouth daily as needed for allergies or rhinitis. ?What changed:  ?when to take this ?reasons to take this ?  ?multivitamin with minerals Tabs tablet ?Take 1 tablet by  mouth daily. ?  ?pantoprazole 20 MG tablet ?Commonly known as: Protonix ?Take 1 tablet (20 mg total) by mouth daily. TAKE WHILE ON ASPIRIN TO PROTECT STOMACH ?  ?triamterene-hydrochlorothiazide 37.5-25 MG capsule ?Commonly known as: DYAZIDE ?Take 1 each (1 capsule total) by mouth daily. ?Start taking on: Nov 22, 2021 ?What changed:  ?when to take this ?reasons to take this ?  ? ?  ? ? Follow-up Information   ? ? [place] an appointment as soon as possible for a visit in 3 month(s).   ?Why: Hospital Follow Up ?Contact information: ?[address]  [address] [address] ?[place] [id number] ?[phone] ? ? ?  ?  ? ? primary care provider. Schedule an appointment as soon as possible for a visit in 1 week(s).   ?Why: Hospital Follow Up and check blood pressure and blood sugar ? ?  ?  ? ?  ?  ? ?  ? ?No Known Allergies ?Allergies as of 11/21/2021   ?No Known Allergies ?  ? ?  ?Medication List  ?  ? ?  TAKE these medications   ? ?aspirin 81 MG EC tablet ?Take 1 tablet (81 mg total) by mouth daily. Swallow whole. ?Start taking on: Nov 22, 2021 ?  ?atorvastatin 40 MG tablet ?Commonly known as: LIPITOR ?Take 1 tablet (40 mg total) by mouth every evening. ?  ?clopidogrel 75 MG tablet ?Commonly known as: PLAVIX ?Take 1 tablet (75 mg total) by mouth daily. ?Start taking on: Nov 22, 2021 ?  ?fexofenadine 180 MG tablet ?Commonly known as: ALLEGRA ?Take 1 tablet (180 mg total) by mouth daily as needed for allergies or rhinitis. ?What changed:  ?when to take this ?reasons to take this ?  ?multivitamin with minerals Tabs tablet ?Take 1 tablet by mouth daily. ?  ?pantoprazole 20 MG tablet ?Commonly known as: Protonix ?Take 1 tablet (20 mg total) by mouth daily. TAKE WHILE ON ASPIRIN TO PROTECT STOMACH ?  ?triamterene-hydrochlorothiazide 37.5-25 MG capsule ?Commonly known as: DYAZIDE ?Take 1 each (1 capsule total) by mouth daily. ?Start taking on: Nov 22, 2021 ?What changed:  ?when to take this ?reasons to take  this ?  ? ?  ? ? ?Procedures/Studies: ?CT ANGIO HEAD NECK W WO CM ? ?Result Date: 11/20/2021 ?CLINICAL DATA:  Neuro deficit, acute, stroke suspected. Slurred speech, resolved. EXAM: CT ANGIOGRAPHY HEAD AND NEC

## 2021-11-21 NOTE — Evaluation (Signed)
Physical Therapy Evaluation ?Patient Details ?Name: Misty Hughes ?MRN: [id number] ?DOB: [birth year] ?Today's Date: 11/21/2021 ? ?History of Present Illness ? Alyx Mcguirk is a 76 y.o. female with no known medical history presented to the ED with complaints of slurred speech.  Patient had 2 episodes of this today.  First episode was about 10:00 in the morning, last episode was about 1 PM today.  Patient was last seen normal at about 6 AM this morning. Patient reports she had towards in her head but she could not get them out, and when she got them out the words were slurred.  Patient's daughter who is not currently at bedside had the slurred speech.  This lasted about half an hour and resolved.  The second episode lasted about the same.  Spouse and patient's daughterShon Hale at bedside.  She also reports today at about 1:00, patient was trying to hold her utensils with her right hand but was unable to.  At this time all of the symptoms have mostly resolved.  She is having some occasional delay voicing some words during my evaluation.  Patient's daughter [name] states this is not normal. ?  ?Clinical Impression ? Patient demonstrated ability to perform bed mobility and transfers with independence indicating good return for home. Patient was able to ambulate 75 feet independently with PT supervision with appropriate strength and balance observed, but primarily limited due to fatigue. Patient demonstrates mild gait deficits with decreased R ankle dorsiflexion but does not impact patient's ambulation status overall.  Patient discharged to care of nursing for ambulation daily as tolerated for length of stay.  ?   ? ?Recommendations for follow up therapy are one component of a multi-disciplinary discharge planning process, led by the attending physician.  Recommendations may be updated based on patient status, additional functional criteria and insurance authorization. ? ?Follow Up Recommendations No PT follow up ? ?   ?Assistance Recommended at Discharge PRN  ?Patient can return home with the following ? A little help with walking and/or transfers;Help with stairs or ramp for entrance ? ?  ?Equipment Recommendations None recommended by PT  ?Recommendations for Other Services ?    ?  ?Functional Status Assessment Patient has not had a recent decline in their functional status  ? ?  ?Precautions / Restrictions Precautions ?Precautions: Fall ?Restrictions ?Weight Bearing Restrictions: No  ? ?  ? ?Mobility ? Bed Mobility ?Overal bed mobility: Independent ?  ?  ?  ?  ?  ?  ?General bed mobility comments: Patient is independent with supine to sit bed mobility task. ?  ? ?Transfers ?Overall transfer level: Independent ?Equipment used: None ?  ?  ?  ?  ?  ?  ?  ?General transfer comment: Patient able to perform sit to stand with no assistive device and demonstrating appropriate amount of strength and balance. ?  ? ?Ambulation/Gait ?Ambulation/Gait assistance: Modified independent (Device/Increase time) ?Gait Distance (Feet): 75 Feet ?Assistive device: None ?Gait Pattern/deviations: Decreased step length - right, Decreased step length - left, Step-through pattern, Decreased stride length, Decreased dorsiflexion - right ?Gait velocity: decreased ?  ?  ?General Gait Details: Patient gait pattern was generally WNL. Patient has mild gait deficits of decreased R dorsiflexion that she believes is attributed to sx experienced with R knee osteoarthritis in which discomfort impacts gait pattern. Patient does have decreased step and stride length impacting gait speed ? ?Stairs ?  ?  ?  ?  ?  ? ?Wheelchair Mobility ?  ? ?Modified  [place] (Stroke Patients Only) ?  ? ?  ? ?Balance Overall balance assessment: Independent ?  ?  ?  ?  ?  ?  ?  ?  ?  ?  ?  ?  ?  ?  ?  ?  ?  ?  ?   ? ? ? ?Pertinent Vitals/Pain Pain Assessment ?Pain Assessment: No/denies pain  ? ? ?Home Living Family/patient expects to be discharged to:: Private residence ?Living  Arrangements: Spouse/significant other ?Available Help at Discharge: Family;Available 24 hours/day ?Type of Home: House ?Home Access: Stairs to enter ?Entrance Stairs-Rails: None ?Entrance Stairs-Number of Steps: 3 ?  ?Home Layout: One level ?Home Equipment: None ?   ?  ?Prior Function Prior Level of Function : Independent/Modified Independent ?  ?  ?  ?  ?  ?  ?Mobility Comments: Patient reports being able to ambulate at home and in community independently. Patient reports driving previously. ?ADLs Comments: Patient reports being able to perform ADL's and functional tasks independently but had help from family if needed. ?  ? ? ?Hand Dominance  ?   ? ?  ?Extremity/Trunk Assessment  ? Upper Extremity Assessment ?Upper Extremity Assessment: Overall WFL for tasks assessed ?  ? ?Lower Extremity Assessment ?Lower Extremity Assessment: Overall WFL for tasks assessed ?  ? ?Cervical / Trunk Assessment ?Cervical / Trunk Assessment: Normal  ?Communication  ? Communication: No difficulties  ?Cognition Arousal/Alertness: Awake/alert ?Behavior During Therapy: [facility] for tasks assessed/performed ?Overall Cognitive Status: Within Functional Limits for tasks assessed ?  ?  ?  ?  ?  ?  ?  ?  ?  ?  ?  ?  ?  ?  ?  ?  ?  ?  ?  ? ?  ?General Comments   ? ?  ?Exercises    ? ?Assessment/Plan  ?  ?PT Assessment Patient does not need any further PT services  ?PT Problem List   ? ?   ?  ?PT Treatment Interventions     ? ?PT Goals (Current goals can be found in the Care Plan section)  ?Acute Rehab PT Goals ?Patient Stated Goal: return home ?PT Goal Formulation: With patient/family ?Time For Goal Achievement: 12/05/21 ?Potential to Achieve Goals: Good ? ?  ?Frequency   ?  ? ? ?Co-evaluation   ?  ?  ?  ?  ? ? ?  ?AM-PAC PT "6 Clicks" Mobility  ?Outcome Measure Help needed turning from your back to your side while in a flat bed without using bedrails?: None ?Help needed moving from lying on your back to sitting on the side of a flat bed without  using bedrails?: None ?Help needed moving to and from a bed to a chair (including a wheelchair)?: None ?Help needed standing up from a chair using your arms (e.g., wheelchair or bedside chair)?: None ?Help needed to walk in hospital room?: None ?Help needed climbing 3-5 steps with a railing? : None ?6 Click Score: 24 ? ?  ?End of Session   ?Activity Tolerance: Patient tolerated treatment well;No increased pain ?Patient left: in bed;with call bell/phone within reach;with family/visitor present ?Nurse Communication: Mobility status ?PT Visit Diagnosis: Unsteadiness on feet (R26.81);Other abnormalities of gait and mobility (R26.89);Muscle weakness (generalized) (M62.81) ?  ? ?Time: [phone] ?PT Time Calculation (min) (ACUTE ONLY): 15 min ? ? ?Charges:   PT Evaluation ?$PT Eval Low Complexity: 1 Low ?PT Treatments ?$Therapeutic Activity: 8-22 mins ?  ?   ? ? ?10:52 AM, 11/21/21 ?Winferd Humphrey  Zakrzewski, S/PT  ? ?

## 2021-11-21 NOTE — Discharge Instructions (Addendum)
PLEASE TAKE ASPIRIN 81 MG AND PLAVIX 75 MG DAILY FOR 3 MONTHS, THEN STOP PLAVIX BUT CONTINUE ASPIRIN DAILY ? ?PLEASE SEE YOUR PRIMARY CARE IN 1 WEEK TO CHECK BLOOD PRESSURE AND BLOOD SUGAR  ? ?PLEASE WEAR 30 DAY EVENT MONITOR.  THEY WILL SEND IT TO YOUR HOUSE AND YOU HAVE TO SEND IT BACK AFTER 30 DAYS.  [employer] WILL CALL YOU TO GET YOUR MAILING INFORMATION FROM AN 888 NUMBER.   ? ?PLEASE SEE A NEUROLOGIST IN 3 MONTHS TO FOLLOW UP STROKE.   ? ? ?IMPORTANT INFORMATION: PAY CLOSE ATTENTION  ? ?PHYSICIAN DISCHARGE INSTRUCTIONS ? ?Follow with Primary care provider  Pcp, No  and other consultants as instructed by your Hospitalist Physician ? ?SEEK MEDICAL CARE OR RETURN TO EMERGENCY ROOM IF SYMPTOMS COME BACK, WORSEN OR NEW PROBLEM DEVELOPS  ? ?Please note: ?You were cared for by a hospitalist during your hospital stay. Every effort will be made to forward records to your primary care provider.  You can request that your primary care provider send for your hospital records if they have not received them.  Once you are discharged, your primary care physician will handle any further medical issues. Please note that NO REFILLS for any discharge medications will be authorized once you are discharged, as it is imperative that you return to your primary care physician (or establish a relationship with a primary care physician if you do not have one) for your post hospital discharge needs so that they can reassess your need for medications and monitor your lab values. ? ?Please get a complete blood count and chemistry panel checked by your Primary MD at your next visit, and again as instructed by your Primary MD. ? ?Get Medicines reviewed and adjusted: ?Please take all your medications with you for your next visit with your Primary MD ? ?Laboratory/radiological data: ?Please request your Primary MD to go over all hospital tests and procedure/radiological results at the follow up, please ask your primary care provider to get  all Hospital records sent to his/her office. ? ?In some cases, they will be blood work, cultures and biopsy results pending at the time of your discharge. Please request that your primary care provider follow up on these results. ? ?If you are diabetic, please bring your blood sugar readings with you to your follow up appointment with primary care.   ? ?Please call and make your follow up appointments as soon as possible.   ? ?Also Note the following: ?If you experience worsening of your admission symptoms, develop shortness of breath, life threatening emergency, suicidal or homicidal thoughts you must seek medical attention immediately by calling 911 or calling your MD immediately  if symptoms less severe. ? ?You must read complete instructions/literature along with all the possible adverse reactions/side effects for all the Medicines you take and that have been prescribed to you. Take any new Medicines after you have completely understood and accpet all the possible adverse reactions/side effects.  ? ?Do not drive when taking Pain medications or sleeping medications (Benzodiazepines) ? ?Do not take more than prescribed Pain, Sleep and Anxiety Medications. It is not advisable to combine anxiety,sleep and pain medications without talking with your primary care practitioner ? ?Special Instructions: If you have smoked or chewed Tobacco  in the last 2 yrs please stop smoking, stop any regular Alcohol  and or any Recreational drug use. ? ?Wear Seat belts while driving.  Do not drive if taking any narcotic, mind altering or controlled substances  or recreational drugs or alcohol.  ? ? ? ? ? ?

## 2021-11-21 NOTE — Assessment & Plan Note (Addendum)
--   pt presented with two small acute infarcts of the left MCA territory. No hemorrhage. Chronic cortical/subcortical infarcts of the right greater than left parietal lobes and bilateral frontal lobes.  ?-- appreciate neurologist recommendations: continue aspirin/plavix x 3 months followed by aspirin 81 mg daily ?-- atorvastatin 40 mg daily  ?-- allowing permissive hypertension for first 24 hours, tomorrow can work on lowering blood pressure to goal ?-- complete full stroke work up with TTE, PT/OT eval.  ?-- ambulatory referral to neurology on discharge ?-- per neurology if TTE does not show thrombus would recommend a 30 day event monitor to look for paroxysmal atrial fibrillation  ?-- I called [facility] and spoke with Lollie Marrow and made arrangements for a 30 day monitor. She said that [employer] will call patient and mail her the monitor to her home.   ?-- TTE : LVEF 55-60% with indeterminate diastolic parameters. 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  I spoke with Dr. Hortense Ramal about this findings and she recommended further work up with lower ext venous doppler study that came back negative but no other work up after that.    ?-- ambulatory referral for neurology in 3 months with Dr. Merlene Laughter ?

## 2021-11-21 NOTE — ED Notes (Signed)
Pt given meal tray. Pt states "I'm not hungry". Pt informed that the tray will be left in case she decides she wants it later. Nurse notified ?

## 2021-11-21 NOTE — Progress Notes (Signed)
OT Cancellation Note ? ?Patient Details ?Name: Zarria Towell ?MRN: [id number] ?DOB: [birth year] ? ? ?Cancelled Treatment:    Reason Eval/Treat Not Completed: Patient at procedure or test/ unavailable. Attempt #1: pt in procedure/test. Attempt #2: Pt with teleneurology. Will attempt to see pt tomorrow if still admitted.  ? ? ? ?Guadelupe Sabin, OTR/L  ?[phone] ?11/21/2021, 11:43 AM ?

## 2021-11-21 NOTE — Consult Note (Signed)
I connected with  Misty Hughes on 11/21/21 by a video enabled telemedicine application and verified that I am speaking with the correct person using two identifiers. ?  ?I discussed the limitations of evaluation and management by telemedicine. The patient expressed understanding and agreed to proceed. ? ?Location of patient: [facility] ?Location of physician: [facility] ? ? ?Neurology Consultation ?Reason for Consult: Stroke ?Referring Physician: Dr. Irwin Brakeman ? ?CC: Gait disturbance and speech disturbance ? ?History is obtained from: Patient, chart review ? ?HPI: Misty Hughes is a 76 y.o. female who presented with gait disturbance as well as trouble getting her words out.  Reports having 2 episodes lasting about 30 minutes.  Was back to baseline at the time of assessment in ED.  Denies ever having similar episodes in the past.  Denies being on any antiplatelets. ? ?Last known normal: 11/20/2021 0600 ?No tPA as NIHSS 0 ?No thrombectomy as NIHSS 0 ?mRS 0 ? ? ?ROS: All other systems reviewed and negative except as noted in the HPI.  ? ?Social History:  reports that she has never smoked. She has never used smokeless tobacco. She reports that she does not drink alcohol and does not use drugs. ? ? ?Medications Prior to Admission  ?Medication Sig Dispense Refill Last Dose  ? fexofenadine (ALLEGRA) 180 MG tablet Take 180 mg by mouth daily.   11/20/2021  ? Multiple Vitamin (MULTIVITAMIN WITH MINERALS) TABS tablet Take 1 tablet by mouth daily.   11/20/2021  ? triamterene-hydrochlorothiazide (DYAZIDE) 37.5-25 MG capsule Take 1 capsule by mouth daily as needed (fluid retention).   unk  ?  ? ? ?Exam: ?Current vital signs: ?BP (!) 153/89   Pulse 86   Temp 98.2 ?F (36.8 ?C) (Oral)   Resp 18   Ht '5\' 1"'$  (1.549 m)   Wt 61.2 kg   SpO2 100%   BMI 25.51 kg/m?  ?Vital signs in last 24 hours: ?Temp:  [98.2 ?F (36.8 ?C)-98.7 ?F (37.1 ?C)] 98.2 ?F (36.8 ?C) (05/15 0900) ?Pulse Rate:  [75-105] 86 (05/15  0900) ?Resp:  [13-25] 18 (05/15 0900) ?BP: (118-153)/(64-89) 153/89 (05/15 0900) ?SpO2:  [95 %-100 %] 100 % (05/15 0900) ?Weight:  [61.2 kg] 61.2 kg (05/14 1456) ? ? ?Physical Exam  ?Constitutional: Appears well-developed and well-nourished.  ?Psych: Affect appropriate to situation ?Eyes: No scleral injection ?Neuro: AOx3, no aphasia, cranial nerves II 12 grossly intact, antigravity strength in all 4 extremities without drift, FTN intact bilaterally, sensation intact to light touch ? ?NIHSS 0 ? ?I have reviewed labs in epic and the results pertinent to this consultation are: ?CBC:  ?Recent Labs  ?Lab 11/20/21 ?1515 11/20/21 ?1548  ?WBC 5.4  --   ?NEUTROABS 4.1  --   ?HGB 12.2 13.3  ?HCT 35.8* 39.0  ?MCV 92.3  --   ?PLT 312  --   ? ? ?Basic Metabolic Panel:  ?Lab Results  ?Component Value Date  ? NA 142 11/20/2021  ? K 4.1 11/20/2021  ? CO2 31 11/20/2021  ? GLUCOSE 165 (H) 11/20/2021  ? BUN 24 (H) 11/20/2021  ? CREATININE 0.90 11/20/2021  ? CALCIUM 9.9 11/20/2021  ? GFRNONAA 57 (L) 11/20/2021  ? ?Lipid Panel:  ?Lab Results  ?Component Value Date  ? Ehrhardt 86 11/21/2021  ? ?HgbA1c: No results found for: HGBA1C ?Urine Drug Screen:  ?   ?Component Value Date/Time  ? LABOPIA NONE DETECTED 11/20/2021 1515  ? COCAINSCRNUR NONE DETECTED 11/20/2021 1515  ? LABBENZ NONE DETECTED 11/20/2021 1515  ?  AMPHETMU NONE DETECTED 11/20/2021 1515  ? THCU NONE DETECTED 11/20/2021 1515  ? LABBARB NONE DETECTED 11/20/2021 1515  ?  ?Alcohol Level  ?   ?Component Value Date/Time  ? ETH <10 11/20/2021 1954  ? ? ?I have reviewed the images obtained: ? ?CT head without contrast 11/20/2021: Small chronic right parietal cortical infarct. ? ?CT angio head and neck with and without contrast  11/20/2021: Proximal to mid left M2 branch occlusion. No visible acute ?infarct or hemorrhage on noncontrast CT. Widely patent cervical carotid and vertebral arteries. ? ?MRI brain without contrast 11/21/2021: Two small acute infarcts of the left MCA territory.  No hemorrhage. Chronic cortical/subcortical infarcts of the right greater than left parietal lobes and bilateral frontal lobes. ? ? ? ? ?ASSESSMENT/PLAN: 76 year old female presented with weight disturbance ? ?Acute ischemic stroke ?-Etiology: Cardioembolic versus atheroembolic ? ?Recommendations: ?-Aspirin 81 mg daily and Plavix 75 mg daily for 3 months followed by aspirin 81 mg daily ?-Atorvastatin 40 mg daily for secondary stroke prevention ?-TTE ordered and pending.  If TTE does not show any thrombus, recommend 30-day event monitor to look for paroxysmal A-fib ?-Goal blood pressure: Permissive hypertension till tomorrow followed by normotension.  Avoid hypotension in the setting of intracranial thrombus ?-PT/OT/SLP ?-Stroke education including BEFAST ?-Stat CT head and CT perfusion for any change in neurologic status ?-Ambulatory referral to neurology upon discharge ?-Follow-up with Dr. Merlene Laughter in 3 months ? ? ?Thank you for allowing [place] to participate in the care of this patient. If you have any further questions, please contact  me or neurohospitalist.  ? ?Zeb Comfort ?Epilepsy ?[facility] neuro[facility]ist ? ? ? ? ?

## 2021-11-21 NOTE — Assessment & Plan Note (Addendum)
--   allow permissive hypertension in setting of acute CVA ?-- work on improving to normal blood pressures starting tomorrow  ?-- pt advised to resume her home BP meds tomorrow and to see her PCP in 1 week to have BP checked again and further titrate / adjust therapy as needed.  ?

## 2021-11-21 NOTE — Progress Notes (Signed)
?  Transition of Care (TOC) Screening Note ? ? ?Patient Details  ?Name: Misty Hughes ?Date of Birth: 02/06/1946 ? ? ?Transition of Care (TOC) CM/SW Contact:    ?Ziggy Reveles D, LCSW ?Phone Number: ?11/21/2021, 2:16 PM ? ? ? ?Transition of Care Department [facility]) has reviewed patient and no TOC needs have been identified at this time. We will continue to monitor patient advancement through interdisciplinary progression rounds. If new patient transition needs arise, please place a TOC consult. ?  ?

## 2021-11-21 NOTE — Progress Notes (Signed)
Echocardiogram ?2D Echocardiogram has been performed. ? Misty Hughes ?11/21/2021, 11:52 AM ?

## 2021-11-21 NOTE — Progress Notes (Signed)
11/21/2021 ?1:20 PM ? ?I called [facility] and spoke with Lollie Marrow about arranging 30 day event monitor.  She is going to enroll patient in [employer] and they will call patient and send monitor to patient's home with instructions etc.   ? ?C. Wynetta Emery, MD  ?

## 2021-11-21 NOTE — Hospital Course (Signed)
76 y.o. female with no known medical history presented to the ED with complaints of slurred speech.  Patient had 2 episodes of this today.  First episode was about 10:00 in the morning, last episode was about 1 PM today.  Patient was last seen normal at about 6 AM this morning. Patient reports she had towards in her head but she could not get them out, and when she got them out the words were slurred.  Patient's daughter who is not currently at bedside had the slurred speech.  This lasted about half an hour and resolved.  The second episode lasted about the same.  Spouse and patient's daughterShon Hale at bedside.  She also reports today at about 1:00, patient was trying to hold her utensils with her right hand but was unable to.  At this time all of the symptoms have mostly resolved.  She is having some occasional delay voicing some words during my evaluation.  Patient's daughter [name] states this is not normal. ?  ?Patient is not on any medications. ?  ?ED Course: Stable vitals.  Head CT-without acute abnormality.  CTA neck without acute abnormality.  CTA head shows proximal to mid left M2 branch occlusion.  No visible acute infarct or hemorrhage. ?  ?TELE-neurology consulted, CTA head findings noted, per notes based on patient's minimal symptoms, she does not meet criteria for intra-arterial thrombectomy.  Obtain CT perfusion if change in mental status.  If patient remains at [facility] due to bed unavailability, work-up can be completed here, neurology here to follow.   ? ?11/21/2021: tele-neurology consultation with Dr. Hortense Ramal.   ?

## 2021-12-06 ENCOUNTER — Ambulatory Visit (INDEPENDENT_AMBULATORY_CARE_PROVIDER_SITE_OTHER): Payer: BC Managed Care – PPO

## 2021-12-06 DIAGNOSIS — I471 Supraventricular tachycardia: Secondary | ICD-10-CM | POA: Diagnosis not present

## 2021-12-06 DIAGNOSIS — I639 Cerebral infarction, unspecified: Secondary | ICD-10-CM | POA: Diagnosis not present

## 2021-12-06 DIAGNOSIS — I4891 Unspecified atrial fibrillation: Secondary | ICD-10-CM | POA: Diagnosis not present

## 2021-12-07 ENCOUNTER — Other Ambulatory Visit: Payer: Self-pay | Admitting: Family Medicine

## 2021-12-07 ENCOUNTER — Encounter (HOSPITAL_COMMUNITY): Payer: Self-pay | Admitting: Family Medicine

## 2021-12-07 DIAGNOSIS — I480 Paroxysmal atrial fibrillation: Secondary | ICD-10-CM

## 2021-12-07 NOTE — Progress Notes (Signed)
I received a communication today from cardiology fellow C. Wroble that patient's 30 day event monitor did show atrial fibrillation.  I sent a letter to her PCP Dr. Delrae Rend and I have placed an ambulatory referral to cardiology.   Murvin Natal, MD

## 2021-12-08 ENCOUNTER — Encounter: Payer: Self-pay | Admitting: Interventional Cardiology

## 2021-12-08 ENCOUNTER — Telehealth: Payer: Self-pay

## 2021-12-08 ENCOUNTER — Telehealth (INDEPENDENT_AMBULATORY_CARE_PROVIDER_SITE_OTHER): Payer: BC Managed Care – PPO | Admitting: Interventional Cardiology

## 2021-12-08 VITALS — BP 110/68 | Ht 61.0 in | Wt 132.0 lb

## 2021-12-08 DIAGNOSIS — I639 Cerebral infarction, unspecified: Secondary | ICD-10-CM | POA: Diagnosis not present

## 2021-12-08 DIAGNOSIS — I10 Essential (primary) hypertension: Secondary | ICD-10-CM | POA: Diagnosis not present

## 2021-12-08 DIAGNOSIS — I4891 Unspecified atrial fibrillation: Secondary | ICD-10-CM | POA: Diagnosis not present

## 2021-12-08 DIAGNOSIS — I471 Supraventricular tachycardia: Secondary | ICD-10-CM | POA: Diagnosis not present

## 2021-12-08 MED ORDER — METOPROLOL TARTRATE 25 MG PO TABS: 12.5000 mg | ORAL_TABLET | Freq: Two times a day (BID) | ORAL | 1 refills | Status: DC

## 2021-12-08 MED ORDER — APIXABAN 5 MG PO TABS: 5.0000 mg | ORAL_TABLET | Freq: Two times a day (BID) | ORAL | 6 refills | Status: DC

## 2021-12-08 NOTE — Telephone Encounter (Signed)
   Cardiac Monitor Alert  Date of alert:  12/08/2021 for 12/06/2021 alert at 330pm  Patient Name: Misty Hughes  DOB: 04/03/1946  MRN: [id number]   [facility] Cardiologist: None - Pt has been referred to [place] EP:  None    Monitor Information: Cardiac Event Monitor [[employer]]  Reason:  Cryptogenic Stroke Ordering provider:  Clanford [name]/Michael Cooper   Alert Atrial Fibrillation/Flutter This is the 1st alert for this rhythm.  The patient has no hx of Atrial Fibrillation/Flutter.  The patient is not currently on anticoagulation.  Next Cardiology Appointment Date:  Pt has been referred 12/07/2021 Provider: [facility]  The patient could NOT be reached by telephone today.  RN left voicemail message at [phone] and [phone] to contact [place] [facility] [place] phone number was obtained from daughter who is contact but not on DPR. Arrhythmia, symptoms and history reviewed with Dr Irish Lack, [place].  Plan:  Will continue to try to reach pt. To schedule appointment and discuss current symptoms.  Other: This is a note in [place] from hospitalist - [place] dated 12/07/2021 I received a communication today from cardiology fellow C. Wroble that patient's 30 day event monitor did show atrial fibrillation.  I sent a letter to her PCP Dr. Delrae Rend and I have placed an ambulatory referral to cardiology.    Murvin Natal, MD    Thora Lance, RN  12/08/2021 9:23 AM

## 2021-12-08 NOTE — Progress Notes (Signed)
Virtual Visit via Telephone Note   Because of Misty Hughes's co-morbid illnesses, she is at least at moderate risk for complications without adequate follow up.  This format is felt to be most appropriate for this patient at this time.  The patient did not have access to video technology/had technical difficulties with video requiring transitioning to audio format only (telephone).  All issues noted in this document were discussed and addressed.  No physical exam could be performed with this format.  Please refer to the patient's chart for her consent to telehealth for [facility].    Date:  12/08/2021   ID:  Misty Hughes, DOB [birth year], MRN [id number] The patient was identified using 2 identifiers.  Patient Location: Home Provider Location: Office/Clinic   PCP:  Pcp, No   [facility] Providers Cardiologist:  None     Evaluation Performed:  New Patient Evaluation  Chief Complaint:  stroke  History of Present Illness:    Misty Hughes is a 76 y.o. female who is being seen today for the evaluation of AFib at the request of No ref. provider found.   Patient had a monitor placed for CVA.  This revealed atrial fibrillation.  She has been started on aspirin and clopidogrel for CVA.  Echo in May 2023 showed: "Left ventricular ejection fraction, by estimation, is 55 to 60%. The  left ventricle has normal function. The left ventricle has no regional  wall motion abnormalities. Left ventricular diastolic parameters are  indeterminate.   2. Right ventricular systolic function is normal. The right ventricular  size is normal. There is mildly elevated pulmonary artery systolic  pressure.   3. Left atrial size was severely dilated.   4. Right atrial size was severely dilated.   5. The mitral valve is abnormal. Mild mitral valve regurgitation. No  evidence of mitral stenosis.   6. The tricuspid valve is abnormal. Tricuspid valve regurgitation is  moderate.   7. The aortic valve  is tricuspid. Aortic valve regurgitation is not  visualized. No aortic stenosis is present.   8. The inferior vena cava is normal in size with greater than 50%  respiratory variability, suggesting right atrial pressure of 3 mmHg.   9. Evidence of atrial level shunting detected by color flow Doppler.  There is a small patent foramen ovale with predominantly left to right  shunting across the atrial septum. "  No residual defects from CVA.  She feels well.  She did not notice any palpitations.  Denies : Chest pain. Dizziness. Leg edema. Nitroglycerin use. Orthopnea. Palpitations. Paroxysmal nocturnal dyspnea. Shortness of breath. Syncope.   Past Medical History:  Diagnosis Date   Allergic rhinitis    Hyperlipidemia LDL goal <70    Hypertension    Stroke ([place]) 11/21/2021   History reviewed. No pertinent surgical history.   Current Meds  Medication Sig   amLODipine (NORVASC) 2.5 MG tablet Take 2.5 mg by mouth daily.   apixaban (ELIQUIS) 5 MG TABS tablet Take 1 tablet (5 mg total) by mouth 2 (two) times daily.   atorvastatin (LIPITOR) 40 MG tablet Take 1 tablet (40 mg total) by mouth every evening.   fexofenadine (ALLEGRA) 180 MG tablet Take 1 tablet (180 mg total) by mouth daily as needed for allergies or rhinitis.   metoprolol tartrate (LOPRESSOR) 25 MG tablet Take 0.5 tablets (12.5 mg total) by mouth 2 (two) times daily.   Multiple Vitamin (MULTIVITAMIN WITH MINERALS) TABS tablet Take 1 tablet by mouth daily.   pantoprazole (  PROTONIX) 20 MG tablet Take 1 tablet (20 mg total) by mouth daily. TAKE WHILE ON ASPIRIN TO PROTECT STOMACH   triamterene-hydrochlorothiazide (DYAZIDE) 37.5-25 MG capsule Take 1 each (1 capsule total) by mouth daily.   [DISCONTINUED] aspirin EC 81 MG EC tablet Take 1 tablet (81 mg total) by mouth daily. Swallow whole.   [DISCONTINUED] clopidogrel (PLAVIX) 75 MG tablet Take 1 tablet (75 mg total) by mouth daily.     Allergies:   Patient has no known allergies.    Social History   Tobacco Use   Smoking status: Never   Smokeless tobacco: Never  Substance Use Topics   Alcohol use: Never   Drug use: Never     Family Hx: The patient's family history includes Hypertension in her mother and sister.  ROS:   Please see the history of present illness.    Speech abnormality during stroke but this has resolved All other systems reviewed and are negative.   Prior CV studies:   The following studies were reviewed today:  Echo with normal LV/RV function; dilated atria  Labs/Other Tests and Data Reviewed:    EKG:   junctional tach in hospital; monitor showed AFib  Recent Labs: 11/20/2021: ALT 17; BUN 24; Creatinine, Ser 0.90; Hemoglobin 13.3; Platelets 312; Potassium 4.1; Sodium 142   Recent Lipid Panel Lab Results  Component Value Date/Time   CHOL 147 11/21/2021 05:39 AM   TRIG 23 11/21/2021 05:39 AM   HDL 56 11/21/2021 05:39 AM   CHOLHDL 2.6 11/21/2021 05:39 AM   LDLCALC 86 11/21/2021 05:39 AM    Wt Readings from Last 3 Encounters:  12/08/21 132 lb (59.9 kg)  11/20/21 135 lb (61.2 kg)  09/08/21 150 lb (68 kg)     Risk Assessment/Calculations:    CHA2DS2-VASc Score = 6   This indicates a 9.7% annual risk of stroke. The patient's score is based upon: CHF History: 0 HTN History: 1 Diabetes History: 0 Stroke History: 2 Vascular Disease History: 0 Age Score: 2 Gender Score: 1         Objective:    Vital Signs:  BP 110/68   Ht '5\' 1"'$  (1.549 m)   Wt 132 lb (59.9 kg)   BMI 24.94 kg/m    VITAL SIGNS:  reviewed GEN:  no acute distress RESPIRATORY:  not short of breath NEURO:  alert and oriented x 3, no obvious focal deficit PSYCH:  normal affect  ASSESSMENT & PLAN:    Atrial fibrillation: Borderline rate control.  Given recent CVA, this seems like the most likely culprit.  Stop aspirin and Plavix.  Start Eliquis 5 mg twice daily.  Stable creatinine and hemoglobin in mid May.  Heart rate increased.  Start metoprolol  12.5 mg twice daily.  Blood pressure at home in the 110s.  This may limit rate control medicines. Hypertension: Low-salt diet.  The current medical regimen is effective;  continue present plan and medications.  Been taking Maxzide for many years.  Recent CVA: Discharged on aspirin and Plavix.  No intervention performed due to minimal symptoms.  She prefers to f/u in [place] because it is closer for her.          CHA2DS2-VASc Score = 6  The patient's score is based upon: CHF History: 0 HTN History: 1 Diabetes History: 0 Stroke History: 2 Vascular Disease History: 0 Age Score: 2 Gender Score: 1       ASSESSMENT AND PLAN: Paroxysmal Atrial Fibrillation (ICD10:  I48.0) The patient's CHA2DS2-VASc score is  6, indicating a 9.7% annual risk of stroke.        SignedLarae Grooms, MD    12/08/2021 5:08 PM      Time:   Today, I have spent 12 minutes with the patient with telehealth technology discussing the above problems.     Medication Adjustments/Labs and Tests Ordered: Current medicines are reviewed at length with the patient today.  Concerns regarding medicines are outlined above.   Tests Ordered: No orders of the defined types were placed in this encounter.   Medication Changes: Meds ordered this encounter  Medications   metoprolol tartrate (LOPRESSOR) 25 MG tablet    Sig: Take 0.5 tablets (12.5 mg total) by mouth 2 (two) times daily.    Dispense:  90 tablet    Refill:  1   apixaban (ELIQUIS) 5 MG TABS tablet    Sig: Take 1 tablet (5 mg total) by mouth 2 (two) times daily.    Dispense:  60 tablet    Refill:  6    Patient to stop aspirin and Clopidogrel    Follow Up:  In Person in 3 week(s) in [place], Larae Grooms, MD  12/08/2021 5:08 PM    [place]

## 2021-12-08 NOTE — Telephone Encounter (Signed)
    Not sure if either Sam or Roderic Palau will be seeing this patient.  Monitor placed after recent stroke and showed atrial fibrillation.  As the office DOD today, I wish on the monitor.  She apparently is on aspirin and Plavix post CVA.  Given the diagnosis of atrial fibrillation, anticoagulation would be more appropriate for stroke prevention.  Attempts been made to contact the patient.  I can try to do a video visit with her to discuss switching her antiplatelet therapy to anticoagulation.  Renal function normal.  Sending the message to both of you in the event that we are not able to contact her today and her next appointment is with either of you in [place].  Jettie Booze, MD

## 2021-12-08 NOTE — Patient Instructions (Signed)
Medication Instructions:  Your physician has recommended you make the following change in your medication: Stop Aspirin and Clopidogrel Start Eliquis 5 mg by mouth twice daily Start metoprolol tartrate 12.5 mg by mouth twice daily.   *If you need a refill on your cardiac medications before your next appointment, please call your pharmacy*   Lab Work: none If you have labs (blood work) drawn today and your tests are completely normal, you will receive your results only by: [place] (if you have MyChart) OR A paper copy in the mail If you have any lab test that is abnormal or we need to change your treatment, we will call you to review the results.   Testing/Procedures: none   Follow-Up: At [facility], you and your health needs are our priority.  As part of our continuing mission to provide you with exceptional heart care, we have created designated Provider Care Teams.  These Care Teams include your primary Cardiologist (physician) and Advanced Practice Providers (APPs -  Physician Assistants and Nurse Practitioners) who all work together to provide you with the care you need, when you need it.  We recommend signing up for the patient portal called "MyChart".  Sign up information is provided on this After Visit Summary.  MyChart is used to connect with patients for Virtual Visits (Telemedicine).  Patients are able to view lab/test results, encounter notes, upcoming appointments, etc.  Non-urgent messages can be sent to your provider as well.   To learn more about what you can do with MyChart, go to [email or web address].    Your next appointment:   2 week(s)  The format for your next appointment:   In Person  Provider:   You may see Dr Domenic Polite or Dr Heath Gold the following Advanced Practice Provider on your designated Care Team:   Katina Dung, NP    Other Instructions    Important Information About [place]

## 2021-12-08 NOTE — Telephone Encounter (Signed)
Spoke with patient who is aware of her monitor result.  Advised of need for visit with MD to discuss starting on anticoagulation.  Phone visit scheduled for this afternoon at 4:30 pm with Dr Irish Lack.  Pt does not have MyChart or a cell phone.    Patient Consent for Virtual Visit        Misty Hughes has provided verbal consent on 12/08/2021 for a virtual visit (video or telephone).   CONSENT FOR VIRTUAL VISIT FOR:  Misty Hughes  By participating in this virtual visit I agree to the following:  I hereby voluntarily request, consent and authorize [facility] and its employed or contracted physicians, physician assistants, nurse practitioners or other licensed health care professionals (the Practitioner), to provide me with telemedicine health care services (the "Services") as deemed necessary by the treating Practitioner. I acknowledge and consent to receive the Services by the Practitioner via telemedicine. I understand that the telemedicine visit will involve communicating with the Practitioner through live audiovisual communication technology and the disclosure of certain medical information by electronic transmission. I acknowledge that I have been given the opportunity to request an in-person assessment or other available alternative prior to the telemedicine visit and am voluntarily participating in the telemedicine visit.  I understand that I have the right to withhold or withdraw my consent to the use of telemedicine in the course of my care at any time, without affecting my right to future care or treatment, and that the Practitioner or I may terminate the telemedicine visit at any time. I understand that I have the right to inspect all information obtained and/or recorded in the course of the telemedicine visit and may receive copies of available information for a reasonable fee.  I understand that some of the potential risks of receiving the Services via telemedicine include:  Delay or  interruption in medical evaluation due to technological equipment failure or disruption; Information transmitted may not be sufficient (e.g. poor resolution of images) to allow for appropriate medical decision making by the Practitioner; and/or  In rare instances, security protocols could fail, causing a breach of personal health information.  Furthermore, I acknowledge that it is my responsibility to provide information about my medical history, conditions and care that is complete and accurate to the best of my ability. I acknowledge that Practitioner's advice, recommendations, and/or decision may be based on factors not within their control, such as incomplete or inaccurate data provided by me or distortions of diagnostic images or specimens that may result from electronic transmissions. I understand that the practice of medicine is not an exact science and that Practitioner makes no warranties or guarantees regarding treatment outcomes. I acknowledge that a copy of this consent can be made available to me via my patient portal ([facility]), or I can request a printed copy by calling the office of [place].    I understand that my insurance will be billed for this visit.   I have read or had this consent read to me. I understand the contents of this consent, which adequately explains the benefits and risks of the Services being provided via telemedicine.  I have been provided ample opportunity to ask questions regarding this consent and the Services and have had my questions answered to my satisfaction. I give my informed consent for the services to be provided through the use of telemedicine in my medical care

## 2021-12-08 NOTE — Telephone Encounter (Signed)
  Pt said, she missed a call from [place] and returning the call

## 2021-12-28 ENCOUNTER — Encounter: Payer: Self-pay | Admitting: Internal Medicine

## 2021-12-28 ENCOUNTER — Ambulatory Visit (INDEPENDENT_AMBULATORY_CARE_PROVIDER_SITE_OTHER): Payer: BC Managed Care – PPO | Admitting: Internal Medicine

## 2021-12-28 VITALS — BP 132/82 | HR 92 | Ht 61.0 in | Wt 146.6 lb

## 2021-12-28 DIAGNOSIS — I639 Cerebral infarction, unspecified: Secondary | ICD-10-CM

## 2021-12-28 DIAGNOSIS — I4891 Unspecified atrial fibrillation: Secondary | ICD-10-CM

## 2021-12-28 DIAGNOSIS — E782 Mixed hyperlipidemia: Secondary | ICD-10-CM | POA: Diagnosis not present

## 2021-12-28 DIAGNOSIS — I10 Essential (primary) hypertension: Secondary | ICD-10-CM | POA: Diagnosis not present

## 2021-12-28 MED ORDER — ATORVASTATIN CALCIUM 40 MG PO TABS: 40.0000 mg | ORAL_TABLET | Freq: Every evening | ORAL | 1 refills | Status: DC

## 2021-12-28 MED ORDER — PANTOPRAZOLE SODIUM 20 MG PO TBEC: 20.0000 mg | DELAYED_RELEASE_TABLET | Freq: Every day | ORAL | 1 refills | Status: DC

## 2021-12-28 NOTE — Patient Instructions (Signed)
Medication Instructions:  Your physician recommends that you continue on your current medications as directed. Please refer to the Current Medication list given to you today.   Labwork: None  Testing/Procedures: None  Follow-Up: Establish with a new cardiologist in 6 months.  Any Other Special Instructions Will Be Listed Below (If Applicable).     If you need a refill on your cardiac medications before your next appointment, please call your pharmacy.

## 2021-12-28 NOTE — Progress Notes (Signed)
OFFICE NOTE  Chief Complaint:  Follow-up A-fib  Primary Care Physician: Misty Rio, MD  HPI:  Misty Hughes is a 76 y.o. female with a past medial history significant for recent diagnosis of stroke, subsequently found to have hypertension and dyslipidemia.  Now she is well managed.  She had an echocardiogram performed by Dr. Irish Hughes which showed LVEF 55 to 60% and severe biatrial enlargement.  She apparently has been unaware of her A-fib.  Subsequently she wore a cardiac monitor and had a telemedicine visit with Dr. Irish Hughes which showed atrial fibrillation.  He had switched her then from aspirin and Plavix to Eliquis.  She is also on metoprolol and atorvastatin.  Blood pressure appears well controlled on amlodipine and Dyazide.  Overall she says she is doing well.  She had requested follow-up in [facility].  PMHx:  Past Medical History:  Diagnosis Date   Allergic rhinitis    Hyperlipidemia LDL goal <70    Hypertension    Stroke ([facility]) 11/21/2021    History reviewed. No pertinent surgical history.  FAMHx:  Family History  Problem Relation Age of Onset   Hypertension Mother    Hypertension Sister     SOCHx:   reports that she has never smoked. She has never used smokeless tobacco. She reports that she does not drink alcohol and does not use drugs.  ALLERGIES:  No Known Allergies  ROS: Pertinent items noted in HPI and remainder of comprehensive ROS otherwise negative.  HOME MEDS: Current Outpatient Medications on File Prior to Visit  Medication Sig Dispense Refill   amLODipine (NORVASC) 2.5 MG tablet Take 2.5 mg by mouth daily.     apixaban (ELIQUIS) 5 MG TABS tablet Take 1 tablet (5 mg total) by mouth 2 (two) times daily. 60 tablet 6   atorvastatin (LIPITOR) 40 MG tablet Take 1 tablet (40 mg total) by mouth every evening. 30 tablet 1   fexofenadine (ALLEGRA) 180 MG tablet Take 1 tablet (180 mg total) by mouth daily as needed for allergies or rhinitis.      metoprolol tartrate (LOPRESSOR) 25 MG tablet Take 0.5 tablets (12.5 mg total) by mouth 2 (two) times daily. 90 tablet 1   Multiple Vitamin (MULTIVITAMIN WITH MINERALS) TABS tablet Take 1 tablet by mouth daily.     pantoprazole (PROTONIX) 20 MG tablet Take 1 tablet (20 mg total) by mouth daily. TAKE WHILE ON ASPIRIN TO PROTECT STOMACH 30 tablet 2   triamterene-hydrochlorothiazide (DYAZIDE) 37.5-25 MG capsule Take 1 each (1 capsule total) by mouth daily.     No current facility-administered medications on file prior to visit.    LABS/IMAGING: No results found for this or any previous visit (from the past 48 hour(s)). No results found.  LIPID PANEL:    Component Value Date/Time   CHOL 147 11/21/2021 0539   TRIG 23 11/21/2021 0539   HDL 56 11/21/2021 0539   CHOLHDL 2.6 11/21/2021 0539   VLDL 5 11/21/2021 0539   LDLCALC 86 11/21/2021 0539     WEIGHTS: Wt Readings from Last 3 Encounters:  12/28/21 146 lb 9.6 oz (66.5 kg)  12/08/21 132 lb (59.9 kg)  11/20/21 135 lb (61.2 kg)    VITALS: BP 132/82   Pulse 92   Ht '5\' 1"'$  (1.549 m)   Wt 146 lb 9.6 oz (66.5 kg)   SpO2 98%   BMI 27.70 kg/m   EXAM: General appearance: alert and no distress Neck: no carotid bruit, no JVD, and thyroid not enlarged,  symmetric, no tenderness/mass/nodules Lungs: clear to auscultation bilaterally Heart: regular rate and rhythm, S1, S2 normal, no murmur, click, rub or gallop Abdomen: soft, non-tender; bowel sounds normal; no masses,  no organomegaly Extremities: extremities normal, atraumatic, no cyanosis or edema Pulses: 2+ and symmetric Skin: Skin color, texture, turgor normal. No rashes or lesions Neurologic: Grossly normal Psych: Pleasant  EKG: N/A  ASSESSMENT: Paroxysmal atrial fibrillation, asymptomatic on Eliquis History of recent stroke Hypertension Dyslipidemia  PLAN: 1.   Overall Misty Hughes is doing well having been diagnosed with atrial fibrillation unfortunately after her  stroke.  She did recover completely from that.  She is now on Eliquis and is tolerating it well.  She is completely unaware of her A-fib and given her severe biatrial enlargement, not an ideal candidate for a rhythm control strategy. She has well-controlled hypertension and is on a high intensity statin.  No changes to her meds today.  Would recommend a follow-up in 6 months with one of the providers in [facility].  Misty Casino, MD, [facility] [place] Director of the Advanced Lipid Disorders &  Cardiovascular Risk Reduction Clinic Diplomate of the American Board of Clinical Lipidology Attending Cardiologist  Direct Dial: [phone]  Fax: [phone]  Website:  [email or web address] Misty Hughes 12/28/2021, 3:29 PM

## 2022-05-31 ENCOUNTER — Other Ambulatory Visit: Payer: Self-pay | Admitting: Interventional Cardiology

## 2022-06-06 NOTE — Progress Notes (Signed)
Cardiology Office Note   Date:  06/09/2022   ID:  Misty Hughes, DOB 12/10/1945, MRN [id number]  PCP:  Leeanne Rio, MD  Cardiologist:   Dorris Carnes, MD    Patient presents for follow up of atrial fibrillation    History of Present Illness: Misty Hughes is a 76 y.o. female with a history of CVA, HTN, HL and atrial fibrillation  Echo LVEF 55 tyo 60%  Severe biatrial enlargement. Pt was seen by C Hilty in June 2023   Monitor after showed she was in afib 70% time  Since seen she denies palpitations   She has never had   Denies CP   Breathing is OK   no dizziness  No edema        Current Meds  Medication Sig   amLODipine (NORVASC) 2.5 MG tablet Take 2.5 mg by mouth daily.   apixaban (ELIQUIS) 5 MG TABS tablet Take 1 tablet (5 mg total) by mouth 2 (two) times daily.   atorvastatin (LIPITOR) 40 MG tablet Take 1 tablet (40 mg total) by mouth every evening.   fexofenadine (ALLEGRA) 180 MG tablet Take 1 tablet (180 mg total) by mouth daily as needed for allergies or rhinitis.   metoprolol tartrate (LOPRESSOR) 25 MG tablet TAKE 1/2 TABLET(12.5 MG) BY MOUTH TWICE DAILY   Multiple Vitamin (MULTIVITAMIN WITH MINERALS) TABS tablet Take 1 tablet by mouth daily.   pantoprazole (PROTONIX) 20 MG tablet Take 1 tablet (20 mg total) by mouth daily. TAKE WHILE ON ASPIRIN TO PROTECT STOMACH   triamterene-hydrochlorothiazide (DYAZIDE) 37.5-25 MG capsule Take 1 each (1 capsule total) by mouth daily.     Allergies:   Patient has no known allergies.   Past Medical History:  Diagnosis Date   Allergic rhinitis    Hyperlipidemia LDL goal <70    Hypertension    Stroke ([facility]) 11/21/2021    History reviewed. No pertinent surgical history.   Social History:  The patient  reports that she has never smoked. She has never used smokeless tobacco. She reports that she does not drink alcohol and does not use drugs.   Family History:  The patient's family history includes Hypertension in her mother  and sister.    ROS:  Please see the history of present illness. All other systems are reviewed and  Negative to the above problem except as noted.    PHYSICAL EXAM: VS:  BP 132/74   Pulse 71   Ht 5' 1.5" (1.562 m)   Wt 148 lb 9.6 oz (67.4 kg)   SpO2 98%   BMI 27.62 kg/m   GEN: Well nourished, well developed, in no acute distress  HEENT: normal  Neck: no JVD, carotid bruits, Cardiac: RRR; no murmurs,   No LE edema  Respiratory:  clear to auscultation bilaterally,  GI: soft, nontender, nondistended, + BS  No hepatomegaly  MS: no deformity Moving all extremities   Skin: warm and dry, no rash Neuro:  Strength and sensation are intact Psych: euthymic mood, full affect   EKG:  EKG is not ordered today.  Echo   May 2023 eft ventricular ejection fraction, by estimation, is 55 to 60%. The left ventricle has normal function. The left ventricle has no regional wall motion abnormalities. Left ventricular diastolic parameters are indeterminate. 1. Right ventricular systolic function is normal. The right ventricular size is normal. There is mildly elevated pulmonary artery systolic pressure. 2. 3. Left atrial size was severely dilated. 4. Right atrial size was severely dilated.  The mitral valve is abnormal. Mild mitral valve regurgitation. No evidence of mitral stenosis. 5. 6. The tricuspid valve is abnormal. Tricuspid valve regurgitation is moderate. The aortic valve is tricuspid. Aortic valve regurgitation is not visualized. No aortic stenosis is present. 7. The inferior vena cava is normal in size with greater than 50% respiratory variability, suggesting right atrial pressure of 3 mmHg. 8. Evidence of atrial level shunting detected by color flow Doppler. 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  Lipid Panel    Component Value Date/Time   CHOL 147 11/21/2021 0539   TRIG 23 11/21/2021 0539   HDL 56 11/21/2021  0539   CHOLHDL 2.6 11/21/2021 0539   VLDL 5 11/21/2021 0539   LDLCALC 86 11/21/2021 0539      Wt Readings from Last 3 Encounters:  06/09/22 148 lb 9.6 oz (67.4 kg)  12/28/21 146 lb 9.6 oz (66.5 kg)  12/08/21 132 lb (59.9 kg)      ASSESSMENT AND PLAN:  1  PAF   Pt never senses   Keep on same regimen  CHeck labs today  2  HTN  BP is fair  Follow   3  Hx CVA   On Eliquis now  4  HL  will check lipids      Follow up in 9 months      Current medicines are reviewed at length with the patient today.  The patient does not have concerns regarding medicines.  Signed, Dorris Carnes, MD  06/09/2022 3:19 PM    [address], [place], [place]  [address] Phone: [phone]; Fax: [phone]

## 2022-06-09 ENCOUNTER — Ambulatory Visit: Payer: BC Managed Care – PPO | Attending: Internal Medicine | Admitting: Internal Medicine

## 2022-06-09 ENCOUNTER — Encounter: Payer: Self-pay | Admitting: Internal Medicine

## 2022-06-09 ENCOUNTER — Other Ambulatory Visit (HOSPITAL_COMMUNITY)
Admission: RE | Admit: 2022-06-09 | Discharge: 2022-06-09 | Disposition: A | Discharge status: Home / Self Care | Payer: BC Managed Care – PPO | Source: Ambulatory Visit | Attending: Internal Medicine | Admitting: Internal Medicine

## 2022-06-09 VITALS — BP 132/74 | HR 71 | Ht 61.5 in | Wt 148.6 lb

## 2022-06-09 DIAGNOSIS — I4891 Unspecified atrial fibrillation: Secondary | ICD-10-CM

## 2022-06-09 DIAGNOSIS — E785 Hyperlipidemia, unspecified: Secondary | ICD-10-CM | POA: Insufficient documentation

## 2022-06-09 DIAGNOSIS — I639 Cerebral infarction, unspecified: Secondary | ICD-10-CM

## 2022-06-09 LAB — CBC
HCT: 31.2 % — ABNORMAL LOW (ref 36.0–46.0)
Hemoglobin: 10.5 g/dL — ABNORMAL LOW (ref 12.0–15.0)
MCH: 30.9 pg (ref 26.0–34.0)
MCHC: 33.7 g/dL (ref 30.0–36.0)
MCV: 91.8 fL (ref 80.0–100.0)
Platelets: 261 10*3/uL (ref 150–400)
RBC: 3.4 MIL/uL — ABNORMAL LOW (ref 3.87–5.11)
RDW: 15.3 % (ref 11.5–15.5)
WBC: 5.5 10*3/uL (ref 4.0–10.5)
nRBC: 0 % (ref 0.0–0.2)

## 2022-06-09 LAB — BASIC METABOLIC PANEL
Anion gap: 9 (ref 5–15)
BUN: 23 mg/dL (ref 8–23)
CO2: 26 mmol/L (ref 22–32)
Calcium: 9 mg/dL (ref 8.9–10.3)
Chloride: 104 mmol/L (ref 98–111)
Creatinine, Ser: 0.89 mg/dL (ref 0.44–1.00)
GFR, Estimated: >60 mL/min (ref >60)
Glucose, Bld: 86 mg/dL (ref 70–99)
Potassium: 3.5 mmol/L (ref 3.5–5.1)
Sodium: 139 mmol/L (ref 135–145)

## 2022-06-09 NOTE — Patient Instructions (Signed)
Medication Instructions:  Your physician recommends that you continue on your current medications as directed. Please refer to the Current Medication list given to you today.   Labwork: CBC,BMET, NMR  Testing/Procedures: None today  Follow-Up: August/September Dr.[name]  Any Other Special Instructions Will Be Listed Below (If Applicable).  If you need a refill on your cardiac medications before your next appointment, please call your pharmacy.

## 2022-06-10 LAB — NMR, LIPOPROFILE
Cholesterol, Total: 146 mg/dL (ref 100–199)
HDL Cholesterol by NMR: 65 mg/dL (ref >39)
HDL Particle Number: 28.4 umol/L — ABNORMAL LOW (ref >=30.5)
LDL Particle Number: 515 nmol/L (ref <1000)
LDL Size: 21 nm (ref >20.5)
LDL-C (NIH Calc): 70 mg/dL (ref 0–99)
LP-IR Score: <25 (ref <=45)
Small LDL Particle Number: <90 nmol/L (ref <=527)
Triglycerides by NMR: 48 mg/dL (ref 0–149)

## 2022-06-13 ENCOUNTER — Telehealth: Payer: Self-pay | Admitting: Internal Medicine

## 2022-06-13 DIAGNOSIS — Z79899 Other long term (current) drug therapy: Secondary | ICD-10-CM

## 2022-06-13 MED ORDER — FERROUS SULFATE 324 (65 FE) MG PO TBEC: 324.0000 mg | DELAYED_RELEASE_TABLET | Freq: Every day | ORAL | 11 refills | Status: DC

## 2022-06-13 NOTE — Telephone Encounter (Signed)
Fay Records, MD 06/12/2022  9:36 PM EST     LIpids are very good   Keep on same meds Hbg is a little low   Electrolytes and kidney function are OK   Recomm:    Add FeSO4   324 daily Follow up CBC in 2 months     I spoke with patient and discussed lab results. She will begin ferrous sulfate 324 mg daily and repeat cbc in 8 weeks

## 2022-06-13 NOTE — Telephone Encounter (Signed)
Patient called to talk with Dr. Harrington Challenger or nurse. Please call back to discuss

## 2022-06-20 ENCOUNTER — Telehealth: Payer: Self-pay | Admitting: Internal Medicine

## 2022-06-20 ENCOUNTER — Telehealth: Payer: Self-pay

## 2022-06-20 DIAGNOSIS — Z79899 Other long term (current) drug therapy: Secondary | ICD-10-CM

## 2022-06-20 NOTE — Telephone Encounter (Signed)
° °  Pt is returning call to get lab result °

## 2022-06-20 NOTE — Telephone Encounter (Signed)
-----   Message from Nuala Alpha, LPN sent at 08/65/7846  8:24 AM EST ----- Linna Hoff pt   ----- Message ----- From: Fay Records, MD Sent: 06/12/2022   9:36 PM EST To: Cv Div [facility] St Triage  LIpids are very good   Keep on same meds  Hbg is a little low   Electrolytes and kidney function are OK  Recomm:    Add FeSO4   324 daily Follow up CBC in 2 months

## 2022-06-20 NOTE — Telephone Encounter (Signed)
Results discussed with pt, repeat lab ordered.

## 2022-06-20 NOTE — Telephone Encounter (Signed)
Results discussed with patient.She agrees to start FeSo4 324 mg daily and repeat cbc mid Feb 2024 at [facility]

## 2022-07-02 ENCOUNTER — Other Ambulatory Visit: Payer: Self-pay | Admitting: Interventional Cardiology

## 2022-07-02 DIAGNOSIS — I4891 Unspecified atrial fibrillation: Secondary | ICD-10-CM

## 2022-07-06 ENCOUNTER — Telehealth: Payer: Self-pay | Admitting: Internal Medicine

## 2022-07-06 NOTE — Telephone Encounter (Signed)
   Pre-operative Risk Assessment    Patient Name: Misty Hughes  DOB: 04/12/1946 MRN: [id number]      Request for Surgical Clearance    Procedure:   Screening Colonoscopy  Date of Surgery:  Clearance TBD                                 Surgeon:  Dr. Erick Blinks Group or Practice Name:  [facility] [place] Phone number:  [phone] Fax number:  [phone]   Type of Clearance Requested:   - Medical  - Pharmacy:  Hold Apixaban (Eliquis) Cardiac Clearance and permission to hold Eliquis   Type of Anesthesia:  Not Indicated   Additional requests/questions:    Louretta Shorten   07/06/2022, 3:32 PM

## 2022-07-06 NOTE — Telephone Encounter (Addendum)
Patient with diagnosis of afib on Eliquis for anticoagulation.    Procedure: colonoscopy Date of procedure: TBD  CHA2DS2-VASc Score = 6  This indicates a 9.7% annual risk of stroke. The patient's score is based upon: CHF History: 0 HTN History: 1 Diabetes History: 0 Stroke History: 2 Vascular Disease History: 0 Age Score: 2 Gender Score: 1   CVA occurred 11/2021, subsequent monitor detected afib.  CrCl 34m/min Platelet count 276K  Per office protocol, patient can hold Eliquis for 1 day prior to procedure. Resume as soon as safely possible after procedure given elevated CV risk.  **This guidance is not considered finalized until pre-operative APP has relayed final recommendations.**

## 2022-07-06 NOTE — Telephone Encounter (Signed)
   Patient Name: Misty Hughes  DOB: July 30, 1945 MRN: [id number]  Primary Cardiologist: None  Chart reviewed as part of pre-operative protocol coverage. Given past medical history and time since last visit, based on ACC/AHA guidelines, Misty Hughes is at acceptable risk for the planned procedure without further cardiovascular testing.   The patient was advised that if she develops new symptoms prior to surgery to contact our office to arrange for a follow-up visit, and she verbalized understanding.  Per office protocol, patient can hold Eliquis for 1 day prior to procedure. Resume as soon as safely possible after procedure given elevated CV risk.   I will route this recommendation to the requesting party via Epic fax function and remove from pre-op pool.  Please call with questions.  Trudi Ida, NP 07/06/2022, 4:45 PM

## 2022-07-19 ENCOUNTER — Other Ambulatory Visit: Payer: Self-pay | Admitting: Internal Medicine

## 2022-07-21 ENCOUNTER — Other Ambulatory Visit: Payer: Self-pay | Admitting: Internal Medicine

## 2022-07-24 ENCOUNTER — Encounter (INDEPENDENT_AMBULATORY_CARE_PROVIDER_SITE_OTHER): Payer: Self-pay | Admitting: *Deleted

## 2022-08-01 ENCOUNTER — Telehealth: Payer: Self-pay | Admitting: *Deleted

## 2022-08-01 NOTE — Telephone Encounter (Signed)
Referring MD/PCP: Dr. Ronnald Nian [facility]  Procedure: Colonoscopy  Reason/Indication:  screening  Has patient had this procedure before?  10 years ago  If so, when, by whom and where?    Is there a family history of colon cancer?  no  Who?  What age when diagnosed?    Is patient diabetic? If yes, Type 1 or Type 2   no      Does patient have prosthetic heart valve or mechanical valve?  no  Do you have a pacemaker/defibrillator?  no  Has patient ever had endocarditis/atrial fibrillation? no  Does patient use oxygen? no  Has patient had joint replacement within last 12 months?  no  Is patient constipated or do they take laxatives? no  Does patient have a history of alcohol/drug use?  no  Have you had a stroke/heart attack last 6 mths? yes  Do you take medicine for weight loss?  no  For female patients,: have you had a hysterectomy no                      are you post menopausal                       do you still have your menstrual cycle no  Is patient on blood thinner such as Coumadin, Plavix and/or Aspirin? Yes Eliquis '5mg'$  BID.  Medications:  Current Outpatient Medications on File Prior to Visit  Medication Sig Dispense Refill   amLODipine (NORVASC) 2.5 MG tablet Take 2.5 mg by mouth daily.     apixaban (ELIQUIS) 5 MG TABS tablet TAKE 1 TABLET(5 MG) BY MOUTH TWICE DAILY 180 tablet 1   atorvastatin (LIPITOR) 40 MG tablet TAKE 1 TABLET(40 MG) BY MOUTH EVERY EVENING 90 tablet 1   Calcium Carb-Cholecalciferol (CALCIUM + D3 PO) Take by mouth daily.     ferrous sulfate 324 (65 Fe) MG TBEC Take 1 tablet (324 mg total) by mouth daily. 30 tablet 11   fexofenadine (ALLEGRA) 180 MG tablet Take 1 tablet (180 mg total) by mouth daily as needed for allergies or rhinitis.     metoprolol tartrate (LOPRESSOR) 25 MG tablet TAKE 1/2 TABLET(12.5 MG) BY MOUTH TWICE DAILY 90 tablet 2   Multiple Vitamin (MULTIVITAMIN WITH MINERALS) TABS tablet Take 1 tablet by mouth daily.     Multiple  Vitamins-Minerals (HAIR SKIN & NAILS PO) Take by mouth daily.     omega-3 acid ethyl esters (LOVAZA) 1 g capsule Take by mouth 2 (two) times daily.     pantoprazole (PROTONIX) 20 MG tablet Take 1 tablet (20 mg total) by mouth daily. TAKE WHILE ON ASPIRIN TO PROTECT STOMACH 90 tablet 1   triamterene-hydrochlorothiazide (DYAZIDE) 37.5-25 MG capsule Take 1 each (1 capsule total) by mouth daily.     No current facility-administered medications on file prior to visit.     Allergies: No Known Allergies

## 2022-08-01 NOTE — Telephone Encounter (Signed)
Room 3   Based on most recent note from cardiology clearance from 07/06/2022 "Per office protocol, patient can hold Eliquis for 1 day prior to procedure. Resume as soon as safely possible after procedure given elevated CV risk".  Please give this recommendation to the patient.  Thanks

## 2022-08-02 ENCOUNTER — Telehealth (INDEPENDENT_AMBULATORY_CARE_PROVIDER_SITE_OTHER): Payer: Self-pay | Admitting: *Deleted

## 2022-08-02 ENCOUNTER — Encounter (INDEPENDENT_AMBULATORY_CARE_PROVIDER_SITE_OTHER): Payer: Self-pay | Admitting: *Deleted

## 2022-08-02 ENCOUNTER — Encounter: Payer: Self-pay | Admitting: *Deleted

## 2022-08-02 MED ORDER — PEG 3350-KCL-NA BICARB-NACL 420 G PO SOLR: 4000.0000 mL | Freq: Once | ORAL | 0 refills | Status: AC

## 2022-08-02 NOTE — Telephone Encounter (Signed)
Pt called back. Scheduled for 2/23 ar 730am. Aware will call back with pre-op appt. Instructions will be mailed. Aware hold iron x 7 days, eliquis only x 1 day. Rx for prep sent to pharmacy.

## 2022-08-02 NOTE — Telephone Encounter (Signed)
LMOVM to call back to schedule 

## 2022-08-02 NOTE — Addendum Note (Signed)
Addended by: Cheron Every on: 08/02/2022 03:53 PM   Modules accepted: Orders

## 2022-08-02 NOTE — Telephone Encounter (Signed)
SEE PRIOR NOTE 

## 2022-08-02 NOTE — Telephone Encounter (Signed)
Referral completed

## 2022-08-02 NOTE — Telephone Encounter (Signed)
Pt reports the insurance she files is BCBS and Medicare.

## 2022-08-02 NOTE — Telephone Encounter (Signed)
Patient called back and left voice to call her back on [phone]

## 2022-08-11 ENCOUNTER — Emergency Department (HOSPITAL_COMMUNITY): Payer: BC Managed Care – PPO

## 2022-08-11 ENCOUNTER — Encounter (HOSPITAL_COMMUNITY): Payer: Self-pay | Admitting: Emergency Medicine

## 2022-08-11 ENCOUNTER — Other Ambulatory Visit: Payer: Self-pay

## 2022-08-11 ENCOUNTER — Inpatient Hospital Stay (HOSPITAL_COMMUNITY)
Admission: EM | Admit: 2022-08-11 | Discharge: 2022-08-12 | DRG: 871 | Disposition: A | Discharge status: Home / Self Care | Payer: BC Managed Care – PPO | Attending: Family Medicine | Admitting: Family Medicine

## 2022-08-11 DIAGNOSIS — R7401 Elevation of levels of liver transaminase levels: Secondary | ICD-10-CM | POA: Diagnosis present

## 2022-08-11 DIAGNOSIS — J9600 Acute respiratory failure, unspecified whether with hypoxia or hypercapnia: Secondary | ICD-10-CM

## 2022-08-11 DIAGNOSIS — R6520 Severe sepsis without septic shock: Secondary | ICD-10-CM | POA: Diagnosis present

## 2022-08-11 DIAGNOSIS — Z8249 Family history of ischemic heart disease and other diseases of the circulatory system: Secondary | ICD-10-CM

## 2022-08-11 DIAGNOSIS — E785 Hyperlipidemia, unspecified: Secondary | ICD-10-CM | POA: Diagnosis present

## 2022-08-11 DIAGNOSIS — R7989 Other specified abnormal findings of blood chemistry: Secondary | ICD-10-CM | POA: Diagnosis present

## 2022-08-11 DIAGNOSIS — J069 Acute upper respiratory infection, unspecified: Secondary | ICD-10-CM | POA: Diagnosis present

## 2022-08-11 DIAGNOSIS — I4891 Unspecified atrial fibrillation: Secondary | ICD-10-CM | POA: Diagnosis present

## 2022-08-11 DIAGNOSIS — I639 Cerebral infarction, unspecified: Secondary | ICD-10-CM | POA: Diagnosis present

## 2022-08-11 DIAGNOSIS — R54 Age-related physical debility: Secondary | ICD-10-CM | POA: Diagnosis present

## 2022-08-11 DIAGNOSIS — D72829 Elevated white blood cell count, unspecified: Secondary | ICD-10-CM | POA: Diagnosis present

## 2022-08-11 DIAGNOSIS — I482 Chronic atrial fibrillation, unspecified: Secondary | ICD-10-CM | POA: Diagnosis present

## 2022-08-11 DIAGNOSIS — A419 Sepsis, unspecified organism: Secondary | ICD-10-CM | POA: Diagnosis present

## 2022-08-11 DIAGNOSIS — Z8673 Personal history of transient ischemic attack (TIA), and cerebral infarction without residual deficits: Secondary | ICD-10-CM | POA: Diagnosis not present

## 2022-08-11 DIAGNOSIS — Z7901 Long term (current) use of anticoagulants: Secondary | ICD-10-CM

## 2022-08-11 DIAGNOSIS — I480 Paroxysmal atrial fibrillation: Secondary | ICD-10-CM | POA: Diagnosis present

## 2022-08-11 DIAGNOSIS — Z1152 Encounter for screening for COVID-19: Secondary | ICD-10-CM | POA: Diagnosis not present

## 2022-08-11 DIAGNOSIS — Z79899 Other long term (current) drug therapy: Secondary | ICD-10-CM | POA: Diagnosis not present

## 2022-08-11 DIAGNOSIS — I10 Essential (primary) hypertension: Secondary | ICD-10-CM | POA: Diagnosis present

## 2022-08-11 DIAGNOSIS — J189 Pneumonia, unspecified organism: Secondary | ICD-10-CM | POA: Diagnosis present

## 2022-08-11 LAB — COMPREHENSIVE METABOLIC PANEL
ALT: 47 U/L — ABNORMAL HIGH (ref 0–44)
AST: 48 U/L — ABNORMAL HIGH (ref 15–41)
Albumin: 3.9 g/dL (ref 3.5–5.0)
Alkaline Phosphatase: 103 U/L (ref 38–126)
Anion gap: 12 (ref 5–15)
BUN: 19 mg/dL (ref 8–23)
CO2: 30 mmol/L (ref 22–32)
Calcium: 9.8 mg/dL (ref 8.9–10.3)
Chloride: 96 mmol/L — ABNORMAL LOW (ref 98–111)
Creatinine, Ser: 0.9 mg/dL (ref 0.44–1.00)
GFR, Estimated: >60 mL/min (ref >60)
Glucose, Bld: 150 mg/dL — ABNORMAL HIGH (ref 70–99)
Potassium: 3.6 mmol/L (ref 3.5–5.1)
Sodium: 138 mmol/L (ref 135–145)
Total Bilirubin: 2.5 mg/dL — ABNORMAL HIGH (ref 0.3–1.2)
Total Protein: 8.3 g/dL — ABNORMAL HIGH (ref 6.5–8.1)

## 2022-08-11 LAB — URINALYSIS, ROUTINE W REFLEX MICROSCOPIC
Bacteria, UA: NONE SEEN
Bilirubin Urine: NEGATIVE
Glucose, UA: NEGATIVE mg/dL
Hgb urine dipstick: NEGATIVE
Ketones, ur: NEGATIVE mg/dL
Nitrite: NEGATIVE
Protein, ur: 100 mg/dL — AB
Specific Gravity, Urine: 1.017 (ref 1.005–1.030)
pH: 7 (ref 5.0–8.0)

## 2022-08-11 LAB — CBC WITH DIFFERENTIAL/PLATELET
Abs Immature Granulocytes: 0.09 10*3/uL — ABNORMAL HIGH (ref 0.00–0.07)
Basophils Absolute: 0 10*3/uL (ref 0.0–0.1)
Basophils Relative: 0 %
Eosinophils Absolute: 0 10*3/uL (ref 0.0–0.5)
Eosinophils Relative: 0 %
HCT: 37.6 % (ref 36.0–46.0)
Hemoglobin: 13 g/dL (ref 12.0–15.0)
Immature Granulocytes: 1 %
Lymphocytes Relative: 8 %
Lymphs Abs: 1.2 10*3/uL (ref 0.7–4.0)
MCH: 31.3 pg (ref 26.0–34.0)
MCHC: 34.6 g/dL (ref 30.0–36.0)
MCV: 90.6 fL (ref 80.0–100.0)
Monocytes Absolute: 1.1 10*3/uL — ABNORMAL HIGH (ref 0.1–1.0)
Monocytes Relative: 7 %
Neutro Abs: 13.2 10*3/uL — ABNORMAL HIGH (ref 1.7–7.7)
Neutrophils Relative %: 84 %
Platelets: 264 10*3/uL (ref 150–400)
RBC: 4.15 MIL/uL (ref 3.87–5.11)
RDW: 14.8 % (ref 11.5–15.5)
WBC: 15.7 10*3/uL — ABNORMAL HIGH (ref 4.0–10.5)
nRBC: 0 % (ref 0.0–0.2)

## 2022-08-11 LAB — MAGNESIUM: Magnesium: 2.1 mg/dL (ref 1.7–2.4)

## 2022-08-11 LAB — PROTIME-INR
INR: 2.1 — ABNORMAL HIGH (ref 0.8–1.2)
INR: 2.1 — ABNORMAL HIGH (ref 0.8–1.2)
Prothrombin Time: 23.6 seconds — ABNORMAL HIGH (ref 11.4–15.2)
Prothrombin Time: 23.3 seconds — ABNORMAL HIGH (ref 11.4–15.2)

## 2022-08-11 LAB — LACTIC ACID, PLASMA
Lactic Acid, Venous: 1.5 mmol/L (ref 0.5–1.9)
Lactic Acid, Venous: 1.4 mmol/L (ref 0.5–1.9)

## 2022-08-11 LAB — RESP PANEL BY RT-PCR (RSV, FLU A&B, COVID)  RVPGX2
Influenza A by PCR: NEGATIVE
Influenza B by PCR: NEGATIVE
Resp Syncytial Virus by PCR: NEGATIVE
SARS Coronavirus 2 by RT PCR: NEGATIVE

## 2022-08-11 LAB — BRAIN NATRIURETIC PEPTIDE: B Natriuretic Peptide: 262 pg/mL — ABNORMAL HIGH (ref 0.0–100.0)

## 2022-08-11 LAB — PHOSPHORUS: Phosphorus: 1.5 mg/dL — ABNORMAL LOW (ref 2.5–4.6)

## 2022-08-11 MED ORDER — SODIUM CHLORIDE 0.9 % IV SOLN: INTRAVENOUS | Status: DC

## 2022-08-11 MED ORDER — ADULT MULTIVITAMIN W/MINERALS CH
1.0000 | ORAL_TABLET | Freq: Every day | ORAL | Status: DC
  Administered 2022-08-12: 1 via ORAL
  Filled 2022-08-11: qty 1

## 2022-08-11 MED ORDER — BISACODYL 5 MG PO TBEC: 5.0000 mg | DELAYED_RELEASE_TABLET | Freq: Every day | ORAL | Status: DC | PRN

## 2022-08-11 MED ORDER — SODIUM CHLORIDE 0.9% FLUSH
3.0000 mL | Freq: Two times a day (BID) | INTRAVENOUS | Status: DC
  Administered 2022-08-12: 3 mL via INTRAVENOUS

## 2022-08-11 MED ORDER — APIXABAN 5 MG PO TABS
5.0000 mg | ORAL_TABLET | Freq: Two times a day (BID) | ORAL | Status: DC
  Administered 2022-08-11 – 2022-08-12 (×2): 5 mg via ORAL
  Filled 2022-08-11 (×2): qty 1

## 2022-08-11 MED ORDER — FERROUS SULFATE 325 (65 FE) MG PO TABS
324.0000 mg | ORAL_TABLET | Freq: Every day | ORAL | Status: DC
  Administered 2022-08-12: 324 mg via ORAL
  Filled 2022-08-11: qty 1

## 2022-08-11 MED ORDER — GUAIFENESIN-DM 100-10 MG/5ML PO SYRP
10.0000 mL | ORAL_SOLUTION | Freq: Three times a day (TID) | ORAL | Status: DC
  Administered 2022-08-11 – 2022-08-12 (×3): 10 mL via ORAL
  Filled 2022-08-11 (×3): qty 10

## 2022-08-11 MED ORDER — FLEET ENEMA 7-19 GM/118ML RE ENEM: 1.0000 | ENEMA | Freq: Once | RECTAL | Status: DC | PRN

## 2022-08-11 MED ORDER — SODIUM CHLORIDE 0.9 % IV SOLN
2.0000 g | INTRAVENOUS | Status: DC
  Administered 2022-08-12: 2 g via INTRAVENOUS
  Filled 2022-08-11: qty 20

## 2022-08-11 MED ORDER — HYDRALAZINE HCL 20 MG/ML IJ SOLN: 10.0000 mg | INTRAMUSCULAR | Status: DC | PRN

## 2022-08-11 MED ORDER — IPRATROPIUM BROMIDE 0.02 % IN SOLN: 0.5000 mg | Freq: Four times a day (QID) | RESPIRATORY_TRACT | Status: DC | PRN

## 2022-08-11 MED ORDER — METOPROLOL TARTRATE 25 MG PO TABS
25.0000 mg | ORAL_TABLET | Freq: Two times a day (BID) | ORAL | Status: DC
  Administered 2022-08-11 – 2022-08-12 (×2): 25 mg via ORAL
  Filled 2022-08-11 (×2): qty 1

## 2022-08-11 MED ORDER — SENNOSIDES-DOCUSATE SODIUM 8.6-50 MG PO TABS: 1.0000 | ORAL_TABLET | Freq: Every evening | ORAL | Status: DC | PRN

## 2022-08-11 MED ORDER — OXYCODONE HCL 5 MG PO TABS: 5.0000 mg | ORAL_TABLET | ORAL | Status: DC | PRN

## 2022-08-11 MED ORDER — METOPROLOL TARTRATE 5 MG/5ML IV SOLN
5.0000 mg | INTRAVENOUS | Status: DC | PRN
  Administered 2022-08-11: 5 mg via INTRAVENOUS
  Filled 2022-08-11: qty 5

## 2022-08-11 MED ORDER — ACETAMINOPHEN 325 MG PO TABS: 650.0000 mg | ORAL_TABLET | Freq: Four times a day (QID) | ORAL | Status: DC | PRN

## 2022-08-11 MED ORDER — HEPARIN SODIUM (PORCINE) 5000 UNIT/ML IJ SOLN: 5000.0000 [IU] | Freq: Three times a day (TID) | INTRAMUSCULAR | Status: DC

## 2022-08-11 MED ORDER — ACETAMINOPHEN 650 MG RE SUPP: 650.0000 mg | Freq: Four times a day (QID) | RECTAL | Status: DC | PRN

## 2022-08-11 MED ORDER — LACTATED RINGERS IV SOLN: INTRAVENOUS | Status: DC

## 2022-08-11 MED ORDER — SODIUM CHLORIDE 0.9 % IV SOLN: 250.0000 mL | INTRAVENOUS | Status: DC | PRN

## 2022-08-11 MED ORDER — ONDANSETRON HCL 4 MG PO TABS: 4.0000 mg | ORAL_TABLET | Freq: Four times a day (QID) | ORAL | Status: DC | PRN

## 2022-08-11 MED ORDER — IPRATROPIUM BROMIDE 0.02 % IN SOLN
0.5000 mg | Freq: Four times a day (QID) | RESPIRATORY_TRACT | Status: DC
  Administered 2022-08-11 – 2022-08-12 (×3): 0.5 mg via RESPIRATORY_TRACT
  Filled 2022-08-11 (×4): qty 2.5

## 2022-08-11 MED ORDER — LACTATED RINGERS IV BOLUS
250.0000 mL | Freq: Once | INTRAVENOUS | Status: AC
  Administered 2022-08-11: 250 mL via INTRAVENOUS

## 2022-08-11 MED ORDER — METHYLPREDNISOLONE SODIUM SUCC 125 MG IJ SOLR
125.0000 mg | Freq: Every day | INTRAMUSCULAR | Status: DC
  Administered 2022-08-11: 125 mg via INTRAVENOUS
  Filled 2022-08-11 (×2): qty 2

## 2022-08-11 MED ORDER — LEVALBUTEROL HCL 0.63 MG/3ML IN NEBU: 0.6300 mg | INHALATION_SOLUTION | Freq: Four times a day (QID) | RESPIRATORY_TRACT | Status: DC | PRN

## 2022-08-11 MED ORDER — SODIUM CHLORIDE 0.9% FLUSH
3.0000 mL | Freq: Two times a day (BID) | INTRAVENOUS | Status: DC
  Administered 2022-08-11 (×2): 3 mL via INTRAVENOUS

## 2022-08-11 MED ORDER — IBUPROFEN 600 MG PO TABS: 600.0000 mg | ORAL_TABLET | Freq: Four times a day (QID) | ORAL | Status: DC | PRN

## 2022-08-11 MED ORDER — TRAZODONE HCL 50 MG PO TABS: 25.0000 mg | ORAL_TABLET | Freq: Every evening | ORAL | Status: DC | PRN

## 2022-08-11 MED ORDER — LEVALBUTEROL HCL 1.25 MG/0.5ML IN NEBU
1.2500 mg | INHALATION_SOLUTION | Freq: Four times a day (QID) | RESPIRATORY_TRACT | Status: DC
  Administered 2022-08-11 – 2022-08-12 (×3): 1.25 mg via RESPIRATORY_TRACT
  Filled 2022-08-11 (×4): qty 0.5

## 2022-08-11 MED ORDER — ATORVASTATIN CALCIUM 40 MG PO TABS: 40.0000 mg | ORAL_TABLET | Freq: Every day | ORAL | Status: DC

## 2022-08-11 MED ORDER — ONDANSETRON HCL 4 MG/2ML IJ SOLN: 4.0000 mg | Freq: Four times a day (QID) | INTRAMUSCULAR | Status: DC | PRN

## 2022-08-11 MED ORDER — SODIUM CHLORIDE 0.9 % IV SOLN
500.0000 mg | INTRAVENOUS | Status: DC
  Administered 2022-08-11: 500 mg via INTRAVENOUS
  Filled 2022-08-11: qty 5

## 2022-08-11 MED ORDER — SODIUM CHLORIDE 0.9 % IV SOLN: 500.0000 mg | INTRAVENOUS | Status: DC

## 2022-08-11 MED ORDER — PANTOPRAZOLE SODIUM 40 MG PO TBEC
40.0000 mg | DELAYED_RELEASE_TABLET | Freq: Every day | ORAL | Status: DC
  Administered 2022-08-12: 40 mg via ORAL
  Filled 2022-08-11: qty 1

## 2022-08-11 MED ORDER — HYDROMORPHONE HCL 1 MG/ML IJ SOLN: 0.5000 mg | INTRAMUSCULAR | Status: DC | PRN

## 2022-08-11 MED ORDER — SODIUM CHLORIDE 0.9 % IV SOLN
2.0000 g | INTRAVENOUS | Status: DC
  Administered 2022-08-11: 2 g via INTRAVENOUS
  Filled 2022-08-11: qty 20

## 2022-08-11 MED ORDER — ACETAMINOPHEN 500 MG PO TABS
1000.0000 mg | ORAL_TABLET | Freq: Once | ORAL | Status: AC
  Administered 2022-08-11: 1000 mg via ORAL
  Filled 2022-08-11: qty 2

## 2022-08-11 NOTE — ED Provider Notes (Signed)
[facility] Provider Note   CSN: [id number] Arrival date & time: 08/11/22  1104     History  Chief Complaint  Patient presents with   Cough   Shortness of Breath   Weakness    Misty Hughes is a 77 y.o. female.  77 year old female with history of CVA, hypertension, paroxysmal atrial fibrillation on Eliquis and metoprolol who presents to the emergency department with URI symptoms and weakness.  States that on Monday she started feeling sick.  Started having a cough as well as chills.  No recorded fevers at home.  Says that she has chest discomfort while coughing but denies any chest pains otherwise.  No nausea or vomiting.  No diarrhea.  No dysuria or frequency.  Has had decreased p.o. intake recently.  Reports being compliant with her Eliquis.  Did have outpatient COVID testing that was negative.  Reports that she felt worse so she came to the emergency department today.       Home Medications Prior to Admission medications   Medication Sig Start Date End Date Taking? Authorizing Provider  amLODipine (NORVASC) 2.5 MG tablet Take 2.5 mg by mouth daily. 11/23/21  Yes [provider]  apixaban (ELIQUIS) 5 MG TABS tablet TAKE 1 TABLET(5 MG) BY MOUTH TWICE DAILY Patient taking differently: Take 5 mg by mouth 2 (two) times daily. 07/03/22  Yes Fay Records, MD  atorvastatin (LIPITOR) 40 MG tablet TAKE 1 TABLET(40 MG) BY MOUTH EVERY EVENING Patient taking differently: Take 40 mg by mouth daily. 07/21/22  Yes Hilty, Nadean Corwin, MD  Calcium Carb-Cholecalciferol (CALCIUM + D3 PO) Take by mouth daily.   Yes [provider]  ferrous sulfate 324 (65 Fe) MG TBEC Take 1 tablet (324 mg total) by mouth daily. 06/13/22  Yes Fay Records, MD  fexofenadine (ALLEGRA) 180 MG tablet Take 1 tablet (180 mg total) by mouth daily as needed for allergies or rhinitis. 11/21/21  Yes [name], [name], MD  metoprolol tartrate (LOPRESSOR) 25 MG tablet  TAKE 1/2 TABLET(12.5 MG) BY MOUTH TWICE DAILY Patient taking differently: Take 12.5 mg by mouth 2 (two) times daily. 05/31/22  Yes Jettie Booze, MD  Multiple Vitamin (MULTIVITAMIN WITH MINERALS) TABS tablet Take 1 tablet by mouth daily.   Yes [provider]  Multiple Vitamins-Minerals (HAIR SKIN & NAILS PO) Take by mouth daily.   Yes [provider]  omega-3 acid ethyl esters (LOVAZA) 1 g capsule Take by mouth 2 (two) times daily.   Yes [provider]  pantoprazole (PROTONIX) 20 MG tablet Take 1 tablet (20 mg total) by mouth daily. TAKE WHILE ON ASPIRIN TO PROTECT STOMACH 12/28/21  Yes Hilty, Nadean Corwin, MD  triamterene-hydrochlorothiazide (DYAZIDE) 37.5-25 MG capsule Take 1 each (1 capsule total) by mouth daily. 11/22/21  Yes Murlean Iba, MD      Allergies    Patient has no known allergies.    Review of Systems   Review of Systems  Physical Exam Updated Vital Signs BP 116/71   Pulse (!) 107   Temp (!) 100.8 F (38.2 C) (Rectal)   Resp (!) 24   SpO2 94%  Physical Exam Vitals and nursing note reviewed.  Constitutional:      General: She is not in acute distress.    Appearance: She is well-developed. She is ill-appearing.  HENT:     Head: Normocephalic and atraumatic.  Eyes:     Conjunctiva/sclera: Conjunctivae normal.  Cardiovascular:  Rate and Rhythm: Tachycardia present. Rhythm irregular.     Heart sounds: No murmur heard. Pulmonary:     Effort: Pulmonary effort is normal. No respiratory distress.     Breath sounds: Wheezing (Diffuse.  Predominantly in left hemifield) present.  Abdominal:     General: There is no distension.     Palpations: Abdomen is soft. There is no mass.     Tenderness: There is no abdominal tenderness. There is no guarding.  Musculoskeletal:        General: No swelling.     Cervical back: Neck supple.     Right lower leg: Edema (1+) present.     Left lower leg: Edema (1+) present.  Skin:    General:  Skin is warm and dry.     Capillary Refill: Capillary refill takes less than 2 seconds.  Neurological:     Mental Status: She is alert.  Psychiatric:        Mood and Affect: Mood normal.     ED Results / Procedures / Treatments   Labs (all labs ordered are listed, but only abnormal results are displayed) Labs Reviewed  CBC WITH DIFFERENTIAL/PLATELET - Abnormal; Notable for the following components:      Result Value   WBC 15.7 (*)    Neutro Abs 13.2 (*)    Monocytes Absolute 1.1 (*)    Abs Immature Granulocytes 0.09 (*)    All other components within normal limits  COMPREHENSIVE METABOLIC PANEL - Abnormal; Notable for the following components:   Chloride 96 (*)    Glucose, Bld 150 (*)    Total Protein 8.3 (*)    AST 48 (*)    ALT 47 (*)    Total Bilirubin 2.5 (*)    All other components within normal limits  PROTIME-INR - Abnormal; Notable for the following components:   Prothrombin Time 23.6 (*)    INR 2.1 (*)    All other components within normal limits  BRAIN NATRIURETIC PEPTIDE - Abnormal; Notable for the following components:   B Natriuretic Peptide 262.0 (*)    All other components within normal limits  PHOSPHORUS - Abnormal; Notable for the following components:   Phosphorus 1.5 (*)    All other components within normal limits  PROTIME-INR - Abnormal; Notable for the following components:   Prothrombin Time 23.3 (*)    INR 2.1 (*)    All other components within normal limits  RESP PANEL BY RT-PCR (RSV, FLU A&B, COVID)  RVPGX2  CULTURE, BLOOD (ROUTINE X 2)  CULTURE, BLOOD (ROUTINE X 2)  EXPECTORATED SPUTUM ASSESSMENT W GRAM STAIN, RFLX TO RESP C  LACTIC ACID, PLASMA  LACTIC ACID, PLASMA  MAGNESIUM  URINALYSIS, ROUTINE W REFLEX MICROSCOPIC  HEPATITIS PANEL, ACUTE  BASIC METABOLIC PANEL  CBC  APTT    EKG EKG Interpretation  Date/Time:  Friday August 11 2022 11:18:31 EST Ventricular Rate:  132 PR Interval:    QRS Duration: 76 QT Interval:  287 QTC  Calculation: 426 R Axis:   57 Text Interpretation: Atrial fibrillation Borderline low voltage, extremity leads Nonspecific T abnormalities, lateral leads Confirmed by Margaretmary Eddy [phone]) on 08/11/2022 11:40:06 AM  Radiology DG Chest Port 1 View  Result Date: 08/11/2022 CLINICAL DATA:  Shortness of breath. Cough, weakness. EXAM: PORTABLE CHEST 1 VIEW COMPARISON:  None Available. FINDINGS: Mild cardiomegaly. Aortic atherosclerosis. Patchy airspace disease in the right mid lung and left mid and lower lung zone. No large pleural effusion. Mild background interstitial coarsening. No  pneumothorax. No definite pulmonary edema. The bones are under mineralized without acute osseous findings. IMPRESSION: 1. Patchy airspace disease in the right mid lung and left mid and lower lung zone, suspicious for pneumonia. 2. Mild cardiomegaly. Electronically Signed   By: Keith Rake M.D.   On: 08/11/2022 12:02    Procedures Procedures   Medications Ordered in ED Medications  metoprolol tartrate (LOPRESSOR) injection 5 mg (5 mg Intravenous Given 08/11/22 1211)  sodium chloride flush (NS) 0.9 % injection 3 mL (has no administration in time range)  sodium chloride flush (NS) 0.9 % injection 3 mL (3 mLs Intravenous Given 08/11/22 1403)  0.9 %  sodium chloride infusion (has no administration in time range)  acetaminophen (TYLENOL) tablet 650 mg (has no administration in time range)    Or  acetaminophen (TYLENOL) suppository 650 mg (has no administration in time range)  oxyCODONE (Oxy IR/ROXICODONE) immediate release tablet 5 mg (has no administration in time range)  HYDROmorphone (DILAUDID) injection 0.5-1 mg (has no administration in time range)  traZODone (DESYREL) tablet 25 mg (has no administration in time range)  senna-docusate (Senokot-S) tablet 1 tablet (has no administration in time range)  bisacodyl (DULCOLAX) EC tablet 5 mg (has no administration in time range)  sodium phosphate (FLEET) 7-19 GM/118ML  enema 1 enema (has no administration in time range)  ondansetron (ZOFRAN) tablet 4 mg (has no administration in time range)    Or  ondansetron (ZOFRAN) injection 4 mg (has no administration in time range)  hydrALAZINE (APRESOLINE) injection 10 mg (has no administration in time range)  levalbuterol (XOPENEX) nebulizer solution 1.25 mg (1.25 mg Nebulization Given 08/11/22 1345)  ipratropium (ATROVENT) nebulizer solution 0.5 mg (0.5 mg Nebulization Given 08/11/22 1345)  cefTRIAXone (ROCEPHIN) 2 g in sodium chloride 0.9 % 100 mL IVPB (has no administration in time range)  azithromycin (ZITHROMAX) 500 mg in sodium chloride 0.9 % 250 mL IVPB (has no administration in time range)  guaiFENesin-dextromethorphan (ROBITUSSIN DM) 100-10 MG/5ML syrup 10 mL (10 mLs Oral Given 08/11/22 1351)  metoprolol tartrate (LOPRESSOR) tablet 25 mg (has no administration in time range)  pantoprazole (PROTONIX) EC tablet 40 mg (has no administration in time range)  apixaban (ELIQUIS) tablet 5 mg (has no administration in time range)  ferrous sulfate tablet 324 mg (has no administration in time range)  multivitamin with minerals tablet 1 tablet (has no administration in time range)  ibuprofen (ADVIL) tablet 600 mg (has no administration in time range)  lactated ringers infusion (has no administration in time range)  methylPREDNISolone sodium succinate (SOLU-MEDROL) 125 mg/2 mL injection 125 mg (125 mg Intravenous Given 08/11/22 1514)  acetaminophen (TYLENOL) tablet 1,000 mg (1,000 mg Oral Given 08/11/22 1352)  lactated ringers bolus 250 mL (0 mLs Intravenous Stopped 08/11/22 1620)    ED Course/ Medical Decision Making/ A&P Clinical Course as of 08/11/22 1732  Fri Aug 11, 2022  1413 Dr Kennyth Lose from internal medicine consulted.  [RP]    Clinical Course User Index [RP] Fransico Meadow, MD                             Medical Decision Making Amount and/or Complexity of Data Reviewed Labs: ordered. Radiology:  ordered.  Risk OTC drugs. Decision regarding hospitalization.   Misty Hughes is a 77 y.o. female with comorbidities that complicate the patient evaluation including CVA, hypertension, paroxysmal atrial fibrillation on Eliquis and metoprolol who presents to the emergency department with URI symptoms  and weakness.   Initial Ddx:  URI, pneumonia, atrial fibrillation RVR, sepsis, electrolyte abnormality, heart failure exacerbation  MDM:  Feel that patient may be having a respiratory infection that is precipitated atrial fibrillation with RVR.  May potentially be septic from this with her fever and tachycardia so will cover with antibiotics at this time.  Will also obtain lab work to assess for electrolyte abnormality that could be contributing also for heart failure exacerbation.  Plan:  Labs Blood culture BNP Lactic acid Chest x-ray EKG Metoprolol Ceftriaxone Azithromycin  ED Summary/Re-evaluation:  Patient underwent the following workup and was found to have pneumonia on her chest x-ray.  She was rate controlled with 5 mg of IV metoprolol.  Range of her workup was largely unremarkable aside from a marginally elevated BNP.  Patient was discussed with medicine and admitted for further management.  This patient presents to the ED for concern of complaints listed in HPI, this involves an extensive number of treatment options, and is a complaint that carries with it a high risk of complications and morbidity. Disposition including potential need for admission considered.   Dispo: Admit to Floor  Additional history obtained from spouse Records reviewed Outpatient Clinic Notes The following labs were independently interpreted: CBC and show  leukocytosis concerning for sepsis I independently reviewed the following imaging with scope of interpretation limited to determining acute life threatening conditions related to emergency care: Chest x-ray and agree with the radiologist  interpretation with the following exceptions: None I personally reviewed and interpreted cardiac monitoring: atrial fibrillation with RVR I personally reviewed and interpreted the pt's EKG: see above for interpretation  I have reviewed the patients home medications and made adjustments as needed Consults: Hospitalist Social Determinants of health:  Elderly  Final Clinical Impression(s) / ED Diagnoses Final diagnoses:  Atrial fibrillation with rapid ventricular response ([place])  Pneumonia due to infectious organism, unspecified laterality, unspecified part of lung  Sepsis, due to unspecified organism, unspecified whether acute organ dysfunction present ([place])    Rx / DC Orders ED Discharge Orders     None      CRITICAL CARE Performed by: Fransico Meadow   Total critical care time: 30 minutes  Critical care time was exclusive of separately billable procedures and treating other patients.  Critical care was necessary to treat or prevent imminent or life-threatening deterioration.  Critical care was time spent personally by me on the following activities: development of treatment plan with patient and/or surrogate as well as nursing, discussions with consultants, evaluation of patient's response to treatment, examination of patient, obtaining history from patient or surrogate, ordering and performing treatments and interventions, ordering and review of laboratory studies, ordering and review of radiographic studies, pulse oximetry and re-evaluation of patient's condition.    Fransico Meadow, MD 08/11/22 [phone]

## 2022-08-11 NOTE — Assessment & Plan Note (Signed)
-   Continue her home dose medication of Eliquis

## 2022-08-11 NOTE — Assessment & Plan Note (Addendum)
    Latest Ref Rng & Units 08/11/2022   11:14 AM 11/20/2021    3:15 PM  Hepatic Function  Total Protein 6.5 - 8.1 g/dL 8.3  7.6   Albumin 3.5 - 5.0 g/dL 3.9  4.3   AST 15 - 41 U/L 48  19   ALT 0 - 44 U/L 47  17   Alk Phosphatase 38 - 126 U/L 103  60   Total Bilirubin 0.3 - 1.2 mg/dL 2.5  1.0    -Likely due to early sepsis, -Avoiding hepatotoxins -Holding her  statins -Will check viral hepatitis panel

## 2022-08-11 NOTE — Assessment & Plan Note (Signed)
-   Met sepsis criteria on arrival due to pneumonia Tachypneic, tachycardic with A-fib, shortness of breath, leukocytosis -Sepsis protocol was initiated with exception of aggressive IV fluid resuscitation due to elevated proBNP, frailty and age--resuscitate gently -Antibiotics according to [place] acquired pneumonia ceftriaxone/azithromycin initiated -Blood cultures has been obtained -Will monitor closely

## 2022-08-11 NOTE — H&P (Signed)
History and Physical   Patient: Misty Hughes                            PCP: Misty Cutter, DO                    DOB: 08-09-1945            DOA: 08/11/2022 RPR:[id number]             DOS: 08/11/2022, 2:44 PM  Misty Cutter, DO  Patient coming from:   HOME  I have personally reviewed patient's medical records, in electronic medical records, including:  [facility], and care everywhere.    Chief Complaint:   Chief Complaint  Patient presents with   Cough   Shortness of Breath   Weakness    History of present illness:    Misty Hughes is a 77 year old female with extensive history of HTN, CVA, A-fib on Eliquis, Presented with sinus symptoms upper respiratory infection cough congestion.  Per patient symptoms started Monday, outside [place] test was negative.  Patient progressively gotten worse with cough, chills, generalized weaknesses.  Patient reports some chest discomfort with coughing but currently denies any chest pain.  ED course: Blood pressure 120/65, pulse (!) 112, temperature (!) 100.8 F (38.2 C), temperature source Rectal, resp. rate (!) 24, SpO2 94 %.   Patient presented to the ED with A-fib with RVR IV metoprolol was given, heart rate improved  BNP 265, lactic acid 1.5, 1.4, WBC 15.7, SARS-CoV-2, influenza A, B >>> negative  Chest x-ray: Patchy airspace disease in the right mid lung and left mid and lower lung zone, suspicious for pneumonia   -Sepsis protocol was initiated, IV antibiotics azithromycin, Rocephin was initiated, blood cultures were obtained Due to elevated BNP some congestion IV fluid has been withheld   Requested patient to be admitted for sepsis due to pneumonia, with A-fib with RVR      Patient Denies having: Fever, Chest Pain, Abd pain, N/V/D, headache, dizziness, lightheadedness,  Dysuria, Joint pain, rash, open wounds  ED Course:   Blood pressure 120/65, pulse (!) 112, temperature (!) 100.8 F (38.2 C), temperature source  Rectal, resp. rate (!) 24, SpO2 95 %. Abnormal labs;   Review of Systems: As per HPI, otherwise 10 point review of systems were negative.   ----------------------------------------------------------------------------------------------------------------------  No Known Allergies  Home MEDs:  Prior to Admission medications   Medication Sig Start Date End Date Taking? Authorizing Provider  amLODipine (NORVASC) 2.5 MG tablet Take 2.5 mg by mouth daily. 11/23/21  Yes [provider]  apixaban (ELIQUIS) 5 MG TABS tablet TAKE 1 TABLET(5 MG) BY MOUTH TWICE DAILY Patient taking differently: Take 5 mg by mouth 2 (two) times daily. 07/03/22  Yes Fay Records, MD  atorvastatin (LIPITOR) 40 MG tablet TAKE 1 TABLET(40 MG) BY MOUTH EVERY EVENING Patient taking differently: Take 40 mg by mouth daily. 07/21/22  Yes Hilty, Nadean Corwin, MD  Calcium Carb-Cholecalciferol (CALCIUM + D3 PO) Take by mouth daily.   Yes [provider]  ferrous sulfate 324 (65 Fe) MG TBEC Take 1 tablet (324 mg total) by mouth daily. 06/13/22  Yes Fay Records, MD  fexofenadine (ALLEGRA) 180 MG tablet Take 1 tablet (180 mg total) by mouth daily as needed for allergies or rhinitis. 11/21/21  Yes [name], [name], MD  metoprolol tartrate (LOPRESSOR) 25 MG tablet TAKE 1/2 TABLET(12.5 MG) BY MOUTH TWICE DAILY Patient taking  differently: Take 12.5 mg by mouth 2 (two) times daily. 05/31/22  Yes Jettie Booze, MD  Multiple Vitamin (MULTIVITAMIN WITH MINERALS) TABS tablet Take 1 tablet by mouth daily.   Yes [provider]  Multiple Vitamins-Minerals (HAIR SKIN & NAILS PO) Take by mouth daily.   Yes [provider]  omega-3 acid ethyl esters (LOVAZA) 1 g capsule Take by mouth 2 (two) times daily.   Yes [provider]  pantoprazole (PROTONIX) 20 MG tablet Take 1 tablet (20 mg total) by mouth daily. TAKE WHILE ON ASPIRIN TO PROTECT STOMACH 12/28/21  Yes Hilty, Nadean Corwin, MD   triamterene-hydrochlorothiazide (DYAZIDE) 37.5-25 MG capsule Take 1 each (1 capsule total) by mouth daily. 11/22/21  Yes [name], [name], MD    PRN MEDs: sodium chloride, acetaminophen **OR** acetaminophen, bisacodyl, hydrALAZINE, HYDROmorphone (DILAUDID) injection, ibuprofen, metoprolol tartrate, ondansetron **OR** ondansetron (ZOFRAN) IV, oxyCODONE, senna-docusate, sodium phosphate, traZODone  Past Medical History:  Diagnosis Date   Allergic rhinitis    Hyperlipidemia LDL goal <70    Hypertension    Stroke ([place]) 11/21/2021    History reviewed. No pertinent surgical history.   reports that she has never smoked. She has never used smokeless tobacco. She reports that she does not drink alcohol and does not use drugs.   Family History  Problem Relation Age of Onset   Hypertension Mother    Hypertension Sister     Physical Exam:   Vitals:   08/11/22 1330 08/11/22 1345 08/11/22 1415 08/11/22 1430  BP: 112/69   120/68  Pulse: (!) 111   (!) 118  Resp: (!) 38     Temp:      TempSrc:      SpO2: 95% 95% 99%    Constitutional: NAD, calm, comfortable Eyes: PERRL, lids and conjunctivae normal ENMT: Mucous membranes are moist. Posterior pharynx clear of any exudate or lesions.Normal dentition.  Neck: normal, supple, no masses, no thyromegaly Respiratory: clear to auscultation bilaterally, no wheezing, no crackles. Normal respiratory effort. No accessory muscle use.  Cardiovascular: Irregularly irregular-transaminitis no murmurs / rubs / gallops. No extremity edema. 2+ pedal pulses. No carotid bruits.  Abdomen: no tenderness, no masses palpated. No hepatosplenomegaly. Bowel sounds positive.  Musculoskeletal: no clubbing / cyanosis. No joint deformity upper and lower extremities. Good ROM, no contractures. Normal muscle tone.  Neurologic: CN II-XII grossly intact. Sensation intact, DTR normal. Strength 5/5 in all 4.  Psychiatric: Normal judgment and insight. Alert and oriented x 3.  Normal mood.  Skin: no rashes, lesions, ulcers. No induration Decubitus/ulcers:  Wounds: per nursing documentation         Labs on admission:    I have personally reviewed following labs and imaging studies  CBC: Recent Labs  Lab 08/11/22 1114  WBC 15.7*  NEUTROABS 13.2*  HGB 13.0  HCT 37.6  MCV 90.6  PLT 500   Basic Metabolic Panel: Recent Labs  Lab 08/11/22 1114 08/11/22 1356  NA 138  --   K 3.6  --   CL 96*  --   CO2 30  --   GLUCOSE 150*  --   BUN 19  --   CREATININE 0.90  --   CALCIUM 9.8  --   MG  --  2.1  PHOS  --  1.5*   GFR: CrCl cannot be calculated (Unknown ideal weight.). Liver Function Tests: Recent Labs  Lab 08/11/22 1114  AST 48*  ALT 47*  ALKPHOS 103  BILITOT 2.5*  PROT 8.3*  ALBUMIN  3.9   No results for input(s): "LIPASE", "AMYLASE" in the last 168 hours. No results for input(s): "AMMONIA" in the last 168 hours. Coagulation Profile: Recent Labs  Lab 08/11/22 1114 08/11/22 1356  INR 2.1* 2.1*   Cardiac Enzymes: No results for input(s): "CKTOTAL", "CKMB", "CKMBINDEX", "TROPONINI" in the last 168 hours. BNP (last 3 results)     Component Value Date/Time   COLORURINE YELLOW 11/20/2021 [place] 11/20/2021 1515   LABSPEC 1.030 11/20/2021 1515   PHURINE 8.0 11/20/2021 1515   GLUCOSEU NEGATIVE 11/20/2021 1515   HGBUR NEGATIVE 11/20/2021 [place] 11/20/2021 1515   [place] 11/20/2021 1515   PROTEINUR NEGATIVE 11/20/2021 1515   NITRITE NEGATIVE 11/20/2021 [place] 11/20/2021 1515    Last A1C:  Lab Results  Component Value Date   HGBA1C 5.0 11/20/2021     Radiologic Exams on Admission:   DG Chest Port 1 View  Result Date: 08/11/2022 CLINICAL DATA:  Shortness of breath. Cough, weakness. EXAM: PORTABLE CHEST 1 VIEW COMPARISON:  None Available. FINDINGS: Mild cardiomegaly. Aortic atherosclerosis. Patchy airspace disease in the right mid lung and left mid  and lower lung zone. No large pleural effusion. Mild background interstitial coarsening. No pneumothorax. No definite pulmonary edema. The bones are under mineralized without acute osseous findings. IMPRESSION: 1. Patchy airspace disease in the right mid lung and left mid and lower lung zone, suspicious for pneumonia. 2. Mild cardiomegaly. Electronically Signed   By: Keith Rake M.D.   On: 08/11/2022 12:02    EKG:   Independently reviewed.  Orders placed or performed during the hospital encounter of 08/11/22   ED EKG   ED EKG   EKG 12-Lead   EKG 12-Lead   EKG 12-Lead   EKG 12-Lead   EKG 12-Lead   ---------------------------------------------------------------------------------------------------------------------------------------    Assessment / Plan:   Principal Problem:   Sepsis ([place]) Active Problems:   Transaminitis   Acute CVA (cerebrovascular accident) ([place])   Essential hypertension   Chronic a-fib (HCC)   HLD (hyperlipidemia)   Assessment and Plan: * Sepsis ([place]) - Met sepsis criteria on arrival due to pneumonia Tachypneic, tachycardic with A-fib, shortness of breath, leukocytosis -Sepsis protocol was initiated with exception of aggressive IV fluid resuscitation due to elevated proBNP, frailty and age--resuscitate gently -Antibiotics according to [place] acquired pneumonia ceftriaxone/azithromycin initiated -Blood cultures has been obtained -Will monitor closely  Transaminitis    Latest Ref Rng & Units 08/11/2022   11:14 AM 11/20/2021    3:15 PM  Hepatic Function  Total Protein 6.5 - 8.1 g/dL 8.3  7.6   Albumin 3.5 - 5.0 g/dL 3.9  4.3   AST 15 - 41 U/L 48  19   ALT 0 - 44 U/L 47  17   Alk Phosphatase 38 - 126 U/L 103  60   Total Bilirubin 0.3 - 1.2 mg/dL 2.5  1.0    -Likely due to early sepsis, -Avoiding hepatotoxins -Holding her  statins -Will check viral hepatitis panel   HLD (hyperlipidemia) - Due to elevated FTs holding statins for  now  Chronic a-fib (HCC) -Presented with A-fib with RVR -Responded 1 dose of IV metoprolol in ED -Continue home dose medication of metoprolol and apixaban -Will titrate metoprolol for better rate control  Essential hypertension - Currently stable, continue metoprolol -Home medication of ; Motrin/HCTZ will be on hold due to meeting sepsis, preventing hypotension  Acute CVA (cerebrovascular accident) ([place]) - Continue her home dose  medication of Eliquis      Consults called:  None -------------------------------------------------------------------------------------------------------------------------------------------- DVT prophylaxis:  TED hose Start: 08/11/22 1332 SCDs Start: 08/11/22 1331 apixaban (ELIQUIS) tablet 5 mg   Code Status:   Code Status: Full Code     Admission status: Patient will be admitted as Inpatient, with a greater than 2 midnight length of stay. Level of care: Telemetry   Family Communication:  none at bedside  (The above findings and plan of care has been discussed with patient in detail, the patient expressed understanding and agreement of above plan)  --------------------------------------------------------------------------------------------------------------------------------------------------  Disposition Plan: >3 days Status is: Inpatient Remains inpatient appropriate because: An IV antibiotics, IV meds for rate control     ----------------------------------------------------------------------------------------------------------------------------------------------------  Time spent: > than  40  Min.  Was spent seeing and evaluating the patient, reviewing all medical records, drawn plan of care.  SIGNED: Deatra James, MD, FHM. FAAFP. [employer] [facility], Pager  (Please use [email or web address] to page/ or secure chat through epic) If 7PM-7AM, please contact night-coverage [email or web address],  08/11/2022, 2:44 PM

## 2022-08-11 NOTE — Hospital Course (Signed)
Mesha Schamberger is a 77 year old female with extensive history of HTN, CVA, A-fib on Eliquis, Presented with sinus symptoms upper respiratory infection cough congestion.  Per patient symptoms started Monday, outside [place] test was negative.  Patient progressively gotten worse with cough, chills, generalized weaknesses.  Patient reports some chest discomfort with coughing but currently denies any chest pain.  ED course: Blood pressure 120/65, pulse (!) 112, temperature (!) 100.8 F (38.2 C), temperature source Rectal, resp. rate (!) 24, SpO2 94 %.   Patient presented to the ED with A-fib with RVR IV metoprolol was given, heart rate improved  BNP 265, lactic acid 1.5, 1.4, WBC 15.7, SARS-CoV-2, influenza A, B >>> negative  Chest x-ray: Patchy airspace disease in the right mid lung and left mid and lower lung zone, suspicious for pneumonia   -Sepsis protocol was initiated, IV antibiotics azithromycin, Rocephin was initiated, blood cultures were obtained Due to elevated BNP some congestion IV fluid has been withheld   Requested patient to be admitted for sepsis due to pneumonia, with A-fib with RVR

## 2022-08-11 NOTE — ED Triage Notes (Signed)
Cough/sob/gen weakness x 4 days. Pt a/o. Able to get from w/c to stretcher with no help. Mild sob noted. Able to finish sentences. Pain to chest only with coughing. Color wnl.

## 2022-08-11 NOTE — ED Notes (Signed)
Attempted to call report. Per Network engineer, pt not yet approved.

## 2022-08-11 NOTE — Sepsis Progress Note (Signed)
eLink is following this Code Sepsis. °

## 2022-08-11 NOTE — Assessment & Plan Note (Addendum)
-  Presented with A-fib with RVR -Responded 1 dose of IV metoprolol in ED -Continue home dose medication of metoprolol and apixaban -Will titrate metoprolol for better rate control

## 2022-08-11 NOTE — Assessment & Plan Note (Signed)
-   Due to elevated FTs holding statins for now

## 2022-08-11 NOTE — ED Notes (Signed)
Attempted to call report. Per Network engineer, bed has not been approved by charge nurse yet.

## 2022-08-11 NOTE — Assessment & Plan Note (Signed)
-   Currently stable, continue metoprolol -Home medication of ; Motrin/HCTZ will be on hold due to meeting sepsis, preventing hypotension

## 2022-08-12 DIAGNOSIS — R6520 Severe sepsis without septic shock: Secondary | ICD-10-CM | POA: Diagnosis not present

## 2022-08-12 DIAGNOSIS — J9600 Acute respiratory failure, unspecified whether with hypoxia or hypercapnia: Secondary | ICD-10-CM | POA: Diagnosis not present

## 2022-08-12 DIAGNOSIS — A419 Sepsis, unspecified organism: Secondary | ICD-10-CM | POA: Diagnosis not present

## 2022-08-12 LAB — CBC
HCT: 33 % — ABNORMAL LOW (ref 36.0–46.0)
Hemoglobin: 11.5 g/dL — ABNORMAL LOW (ref 12.0–15.0)
MCH: 30.9 pg (ref 26.0–34.0)
MCHC: 34.8 g/dL (ref 30.0–36.0)
MCV: 88.7 fL (ref 80.0–100.0)
Platelets: 250 10*3/uL (ref 150–400)
RBC: 3.72 MIL/uL — ABNORMAL LOW (ref 3.87–5.11)
RDW: 15.2 % (ref 11.5–15.5)
WBC: 12.9 10*3/uL — ABNORMAL HIGH (ref 4.0–10.5)
nRBC: 0 % (ref 0.0–0.2)

## 2022-08-12 LAB — BASIC METABOLIC PANEL
Anion gap: 10 (ref 5–15)
BUN: 35 mg/dL — ABNORMAL HIGH (ref 8–23)
CO2: 29 mmol/L (ref 22–32)
Calcium: 9.1 mg/dL (ref 8.9–10.3)
Chloride: 102 mmol/L (ref 98–111)
Creatinine, Ser: 1.13 mg/dL — ABNORMAL HIGH (ref 0.44–1.00)
GFR, Estimated: 50 mL/min — ABNORMAL LOW (ref >60)
Glucose, Bld: 233 mg/dL — ABNORMAL HIGH (ref 70–99)
Potassium: 4.1 mmol/L (ref 3.5–5.1)
Sodium: 141 mmol/L (ref 135–145)

## 2022-08-12 LAB — HEPATITIS PANEL, ACUTE
HCV Ab: NONREACTIVE
Hep A IgM: NONREACTIVE
Hep B C IgM: NONREACTIVE
Hepatitis B Surface Ag: NONREACTIVE

## 2022-08-12 LAB — APTT: aPTT: 40 seconds — ABNORMAL HIGH (ref 24–36)

## 2022-08-12 MED ORDER — LEVOFLOXACIN 750 MG PO TABS: 750.0000 mg | ORAL_TABLET | Freq: Every day | ORAL | 0 refills | Status: AC

## 2022-08-12 MED ORDER — GUAIFENESIN-DM 100-10 MG/5ML PO SYRP: 10.0000 mL | ORAL_SOLUTION | Freq: Three times a day (TID) | ORAL | 0 refills | Status: DC

## 2022-08-12 MED ORDER — AZITHROMYCIN 250 MG PO TABS
500.0000 mg | ORAL_TABLET | Freq: Every day | ORAL | Status: DC
  Administered 2022-08-12: 500 mg via ORAL
  Filled 2022-08-12: qty 2

## 2022-08-12 MED ORDER — METHYLPREDNISOLONE SODIUM SUCC 125 MG IJ SOLR
80.0000 mg | Freq: Every day | INTRAMUSCULAR | Status: DC
  Administered 2022-08-12: 80 mg via INTRAVENOUS

## 2022-08-12 MED ORDER — LACTINEX PO CHEW: 1.0000 | CHEWABLE_TABLET | Freq: Three times a day (TID) | ORAL | 0 refills | Status: AC

## 2022-08-12 MED ORDER — IBUPROFEN 600 MG PO TABS: 600.0000 mg | ORAL_TABLET | Freq: Four times a day (QID) | ORAL | 0 refills | Status: DC | PRN

## 2022-08-12 MED ORDER — METOPROLOL TARTRATE 25 MG PO TABS: 25.0000 mg | ORAL_TABLET | Freq: Two times a day (BID) | ORAL | 1 refills | Status: DC

## 2022-08-12 MED ORDER — METHYLPREDNISOLONE 4 MG PO TBPK: ORAL_TABLET | ORAL | 0 refills | Status: DC

## 2022-08-12 NOTE — Evaluation (Signed)
Physical Therapy Evaluation Patient Details Name: Misty Hughes MRN: [id number] DOB: 09-22-1945 Today's Date: 08/12/2022  History of Present Illness   Ms Misty Hughes is a 77 yo female  who per MD  has" extensive history of HTN, CVA, A-fib on Eliquis, Presented with sinus symptoms upper respiratory infection cough congestion.  Per patient symptoms started Monday, outside [place] test was negative.  Patient progressively gotten worse with cough, chills, generalized weaknesses.  Patient reports some chest discomfort with coughing but currently denies any chest pain."  Clinical Impression  PT states that she has been getting up and going to the bathroom by herself.  PT states she does not want a walker to take home as she will build up her strength on her own.  States she feels a lot better now as compared to admission.        Recommendations for follow up therapy are one component of a multi-disciplinary discharge planning process, led by the attending physician.  Recommendations may be updated based on patient status, additional functional criteria and insurance authorization.  Follow Up Recommendations No PT follow up      Assistance Recommended at Discharge None  Patient can return home with the following       Equipment Recommendations None recommended by PT  Recommendations for Other Services       Functional Status Assessment Patient has had a recent decline in their functional status and demonstrates the ability to make significant improvements in function in a reasonable and predictable amount of time.     Precautions / Restrictions Precautions Precautions: None Restrictions Weight Bearing Restrictions: No      Mobility  Bed Mobility Overal bed mobility: Independent                  Transfers Overall transfer level: Independent                      Ambulation/Gait Ambulation/Gait assistance: Modified independent (Device/Increase time) Gait Distance  (Feet): 70 Feet Assistive device: IV Pole Gait Pattern/deviations: Decreased step length - left, Decreased step length - right                   Pertinent Vitals/Pain Pain Assessment Pain Assessment: No/denies pain    Home Living Family/patient expects to be discharged to:: Private residence Living Arrangements: Spouse/significant other Available Help at Discharge: Family;Available 24 hours/day Type of Home: House Home Access: Stairs to enter Entrance Stairs-Rails: None Entrance Stairs-Number of Steps: 3   Home Layout: One level Home Equipment: None      Prior Function Prior Level of Function : Independent/Modified Independent             Mobility Comments: Patient reports being able to ambulate at home and in community independently. ADLs Comments: Patient reports being able to perform ADL's and functional tasks independently but had help from family if needed.        Extremity/Trunk Assessment        Lower Extremity Assessment Lower Extremity Assessment: Overall WFL for tasks assessed       Communication   Communication: No difficulties  Cognition Arousal/Alertness: Awake/alert Behavior During Therapy: WFL for tasks assessed/performed Overall Cognitive Status: Within Functional Limits for tasks assessed  Assessment/Plan    PT Assessment Patient does not need any further PT services                   AM-PAC PT "6 Clicks" Mobility  Outcome Measure Help needed turning from your back to your side while in a flat bed without using bedrails?: None Help needed moving from lying on your back to sitting on the side of a flat bed without using bedrails?: None Help needed moving to and from a bed to a chair (including a wheelchair)?: None Help needed standing up from a chair using your arms (e.g., wheelchair or bedside chair)?: None Help needed to walk in hospital room?: None Help  needed climbing 3-5 steps with a railing? : A Little 6 Click Score: 23    End of Session Equipment Utilized During Treatment: Gait belt Activity Tolerance: Patient tolerated treatment well Patient left: in chair Nurse Communication: Mobility status PT Visit Diagnosis: Unsteadiness on feet (R26.81)    Time: 1120-1140 PT Time Calculation (min) (ACUTE ONLY): 20 min   Charges:   Leamon Arnt, PT CLT [phone] 08/12/2022, 11:50 AM

## 2022-08-12 NOTE — Discharge Summary (Signed)
Physician Discharge Summary   Patient: Misty Hughes MRN: [id number] DOB: 13-Feb-1946  Admit date:     08/11/2022  Discharge date: 08/12/22  Discharge Physician: Deatra James   PCP: Luciano Cutter, DO   Recommendations at discharge:   Follow-up with PCP in 1 week Continue current medication of p.o. antibiotics of Levaquin, taper down steroids, DuoNeb, inhalers Home medication metoprolol has been increased to 25 mg twice a day   Discharge Diagnoses: Principal Problem:   Sepsis ([place]) Active Problems:   Transaminitis   Acute CVA (cerebrovascular accident) ([place])   Essential hypertension   Chronic a-fib ([place])   HLD (hyperlipidemia)  Resolved Problems:   * No resolved hospital problems. *  Hospital Course: Misty Hughes is a 77 year old female with extensive history of HTN, CVA, A-fib on Eliquis, Presented with sinus symptoms upper respiratory infection cough congestion.  Per patient symptoms started Monday, outside [place] test was negative.  Patient progressively gotten worse with cough, chills, generalized weaknesses.  Patient reports some chest discomfort with coughing but currently denies any chest pain.  ED course: Blood pressure 120/65, pulse (!) 112, temperature (!) 100.8 F (38.2 C), temperature source Rectal, resp. rate (!) 24, SpO2 94 %.   Patient presented to the ED with A-fib with RVR IV metoprolol was given, heart rate improved  BNP 265, lactic acid 1.5, 1.4, WBC 15.7, SARS-CoV-2, influenza A, B >>> negative  Chest x-ray: Patchy airspace disease in the right mid lung and left mid and lower lung zone, suspicious for pneumonia   -Sepsis protocol was initiated, IV antibiotics azithromycin, Rocephin was initiated, blood cultures were obtained Due to elevated BNP some congestion IV fluid has been withheld   Requested patient to be admitted for sepsis due to pneumonia, with A-fib with RVR    Assessment and Plan: * Sepsis ([place]) - Met sepsis criteria on  arrival due to pneumonia Tachypneic, tachycardic with A-fib, shortness of breath, leukocytosis -Sepsis protocol was initiated with exception of aggressive IV fluid resuscitation due to elevated proBNP, frailty and age--resuscitate gently -Antibiotics according to [place] acquired pneumonia ceftriaxone/azithromycin initiated -Blood cultures has been obtained -Will monitor closely  Transaminitis: Improved -Likely due to early sepsis, -Avoiding hepatotoxins -Holding her  statins -Will check viral hepatitis panel   HLD (hyperlipidemia) -Resume statins  Chronic a-fib ([facility]) -Presented with A-fib with RVR -Responded 1 dose of IV metoprolol in ED -Continue home dose medication of metoprolol and apixaban -Toprol was increased to 25 mg p.o. twice daily  Essential hypertension - Currently stable, continue metoprolol   Acute CVA (cerebrovascular accident) ([place]) - Continue her home dose medication of Eliquis   Disposition: Home Diet recommendation:  Discharge Diet Orders (From admission, onward)     Start     Ordered   08/12/22 0000  Diet - low sodium heart healthy        08/12/22 1206           Cardiac diet DISCHARGE MEDICATION: Allergies as of 08/12/2022   No Known Allergies      Medication List     TAKE these medications    amLODipine 2.5 MG tablet Commonly known as: NORVASC Take 2.5 mg by mouth daily.   atorvastatin 40 MG tablet Commonly known as: LIPITOR TAKE 1 TABLET(40 MG) BY MOUTH EVERY EVENING What changed: See the new instructions.   CALCIUM + D3 PO Take by mouth daily.   Eliquis 5 MG Tabs tablet Generic drug: apixaban TAKE 1 TABLET(5 MG) BY MOUTH TWICE DAILY What changed: See  the new instructions.   ferrous sulfate 324 (65 Fe) MG Tbec Take 1 tablet (324 mg total) by mouth daily.   fexofenadine 180 MG tablet Commonly known as: ALLEGRA Take 1 tablet (180 mg total) by mouth daily as needed for allergies or rhinitis.    guaiFENesin-dextromethorphan 100-10 MG/5ML syrup Commonly known as: ROBITUSSIN DM Take 10 mLs by mouth every 8 (eight) hours.   HAIR SKIN & NAILS PO Take by mouth daily.   ibuprofen 600 MG tablet Commonly known as: ADVIL Take 1 tablet (600 mg total) by mouth every 6 (six) hours as needed for fever, headache, mild pain or cramping.   lactobacillus acidophilus & bulgar chewable tablet Chew 1 tablet by mouth 3 (three) times daily with meals for 5 days.   levofloxacin 750 MG tablet Commonly known as: Levaquin Take 1 tablet (750 mg total) by mouth daily for 5 days.   methylPREDNISolone 4 MG Tbpk tablet Commonly known as: MEDROL DOSEPAK Medrol Dosepak take as instructed   metoprolol tartrate 25 MG tablet Commonly known as: LOPRESSOR Take 1 tablet (25 mg total) by mouth 2 (two) times daily. What changed: See the new instructions.   multivitamin with minerals Tabs tablet Take 1 tablet by mouth daily.   omega-3 acid ethyl esters 1 g capsule Commonly known as: LOVAZA Take by mouth 2 (two) times daily.   pantoprazole 20 MG tablet Commonly known as: Protonix Take 1 tablet (20 mg total) by mouth daily. TAKE WHILE ON ASPIRIN TO PROTECT STOMACH   triamterene-hydrochlorothiazide 37.5-25 MG capsule Commonly known as: DYAZIDE Take 1 each (1 capsule total) by mouth daily.        Discharge Exam: There were no vitals filed for this visit.      General:  AAO x 3,  cooperative, no distress;   HEENT:  Normocephalic, PERRL, otherwise with in Normal limits   Neuro:  CNII-XII intact. , normal motor and sensation, reflexes intact   Lungs:   Clear to auscultation BL, Respirations unlabored,  No wheezes / crackles  Cardio:    S1/S2, RRR, No murmure, No Rubs or Gallops   Abdomen:  Soft, non-tender, bowel sounds active all four quadrants, no guarding or peritoneal signs.  Muscular  skeletal:  Limited exam -global generalized weaknesses - in bed, able to move all 4 extremities,   2+  pulses,  symmetric, No pitting edema  Skin:  Dry, warm to touch, negative for any Rashes,  Wounds: Please see nursing documentation          Condition at discharge: good  The results of significant diagnostics from this hospitalization (including imaging, microbiology, ancillary and laboratory) are listed below for reference.   Imaging Studies: DG Chest Port 1 View  Result Date: 08/11/2022 CLINICAL DATA:  Shortness of breath. Cough, weakness. EXAM: PORTABLE CHEST 1 VIEW COMPARISON:  None Available. FINDINGS: Mild cardiomegaly. Aortic atherosclerosis. Patchy airspace disease in the right mid lung and left mid and lower lung zone. No large pleural effusion. Mild background interstitial coarsening. No pneumothorax. No definite pulmonary edema. The bones are under mineralized without acute osseous findings. IMPRESSION: 1. Patchy airspace disease in the right mid lung and left mid and lower lung zone, suspicious for pneumonia. 2. Mild cardiomegaly. Electronically Signed   By: Keith Rake M.D.   On: 08/11/2022 12:02    Microbiology: Results for orders placed or performed during the hospital encounter of 08/11/22  Culture, blood (Routine x 2)     Status: None (Preliminary result)   Collection  Time: 08/11/22 11:16 AM   Specimen: BLOOD  Result Value Ref Range Status   Specimen Description BLOOD LEFT HAND  Final   Special Requests   Final    BOTTLES DRAWN AEROBIC AND ANAEROBIC Blood Culture results may not be optimal due to an inadequate volume of blood received in culture bottles   Culture   Final    NO GROWTH < 24 HOURS Performed at [facility], [address] [place], [place] [address]    Report Status PENDING  Incomplete  Culture, blood (Routine x 2)     Status: None (Preliminary result)   Collection Time: 08/11/22 11:21 AM   Specimen: BLOOD  Result Value Ref Range Status   Specimen Description BLOOD RIGHT ARM  Final   Special Requests   Final    BOTTLES DRAWN AEROBIC AND  ANAEROBIC Blood Culture adequate volume   Culture   Final    NO GROWTH < 24 HOURS Performed at [facility], [address], [place], [place] [address]    Report Status PENDING  Incomplete  Resp panel by RT-PCR (RSV, Flu A&B, Covid) Anterior Nasal Swab     Status: None   Collection Time: 08/11/22 11:29 AM   Specimen: Anterior Nasal Swab  Result Value Ref Range Status   SARS Coronavirus 2 by RT PCR NEGATIVE NEGATIVE Final    Comment: (NOTE) SARS-CoV-2 target nucleic acids are NOT DETECTED.  The SARS-CoV-2 RNA is generally detectable in upper respiratory specimens during the acute phase of infection. The lowest concentration of SARS-CoV-2 viral copies this assay can detect is 138 copies/mL. A negative result does not preclude SARS-Cov-2 infection and should not be used as the sole basis for treatment or other patient management decisions. A negative result may occur with  improper specimen collection/handling, submission of specimen other than nasopharyngeal swab, presence of viral mutation(s) within the areas targeted by this assay, and inadequate number of viral copies(<138 copies/mL). A negative result must be combined with clinical observations, patient history, and epidemiological information. The expected result is Negative.  Fact Sheet for Patients:  EntrepreneurPulse.com.au  Fact Sheet for Healthcare Providers:  IncredibleEmployment.be  This test is no t yet approved or cleared by the [place] FDA and  has been authorized for detection and/or diagnosis of SARS-CoV-2 by FDA under an Emergency Use Authorization (EUA). This EUA will remain  in effect (meaning this test can be used) for the duration of the COVID-19 declaration under Section 564(b)(1) of the Act, 21 U.S.C.section 360bbb-3(b)(1), unless the authorization is terminated  or revoked sooner.       Influenza A by PCR NEGATIVE NEGATIVE Final   Influenza B by PCR NEGATIVE  NEGATIVE Final    Comment: (NOTE) The Xpert Xpress SARS-CoV-2/FLU/RSV plus assay is intended as an aid in the diagnosis of influenza from Nasopharyngeal swab specimens and should not be used as a sole basis for treatment. Nasal washings and aspirates are unacceptable for Xpert Xpress SARS-CoV-2/FLU/RSV testing.  Fact Sheet for Patients: EntrepreneurPulse.com.au  Fact Sheet for Healthcare Providers: IncredibleEmployment.be  This test is not yet approved or cleared by the [place] FDA and has been authorized for detection and/or diagnosis of SARS-CoV-2 by FDA under an Emergency Use Authorization (EUA). This EUA will remain in effect (meaning this test can be used) for the duration of the COVID-19 declaration under Section 564(b)(1) of the Act, 21 U.S.C. section 360bbb-3(b)(1), unless the authorization is terminated or revoked.     Resp Syncytial Virus by PCR NEGATIVE NEGATIVE  Final    Comment: (NOTE) Fact Sheet for Patients: EntrepreneurPulse.com.au  Fact Sheet for Healthcare Providers: IncredibleEmployment.be  This test is not yet approved or cleared by the [place] FDA and has been authorized for detection and/or diagnosis of SARS-CoV-2 by FDA under an Emergency Use Authorization (EUA). This EUA will remain in effect (meaning this test can be used) for the duration of the COVID-19 declaration under Section 564(b)(1) of the Act, 21 U.S.C. section 360bbb-3(b)(1), unless the authorization is terminated or revoked.  Performed at [facility], [address], [place], [place] [address]     Labs: CBC: Recent Labs  Lab 08/11/22 1114 08/12/22 0548  WBC 15.7* 12.9*  NEUTROABS 13.2*  --   HGB 13.0 11.5*  HCT 37.6 33.0*  MCV 90.6 88.7  PLT 264 320   Basic Metabolic Panel: Recent Labs  Lab 08/11/22 1114 08/11/22 1356 08/12/22 0548  NA 138  --  141  K 3.6  --  4.1  CL 96*  --  102  CO2  30  --  29  GLUCOSE 150*  --  233*  BUN 19  --  35*  CREATININE 0.90  --  1.13*  CALCIUM 9.8  --  9.1  MG  --  2.1  --   PHOS  --  1.5*  --    Liver Function Tests: Recent Labs  Lab 08/11/22 1114  AST 48*  ALT 47*  ALKPHOS 103  BILITOT 2.5*  PROT 8.3*  ALBUMIN 3.9   CBG: No results for input(s): "GLUCAP" in the last 168 hours.  Discharge time spent: greater than 30 minutes.  Signed: Deatra James, MD [facility] 08/12/2022

## 2022-08-17 ENCOUNTER — Other Ambulatory Visit: Payer: Self-pay | Admitting: Internal Medicine

## 2022-08-17 LAB — CULTURE, BLOOD (ROUTINE X 2)
Culture: NO GROWTH
Culture: NO GROWTH
Special Requests: ADEQUATE

## 2022-08-30 ENCOUNTER — Encounter (HOSPITAL_COMMUNITY)
Admission: RE | Admit: 2022-08-30 | Discharge: 2022-08-30 | Disposition: A | Discharge status: Home / Self Care | Payer: BC Managed Care – PPO | Source: Ambulatory Visit | Attending: Gastroenterology | Admitting: Gastroenterology

## 2022-08-30 ENCOUNTER — Other Ambulatory Visit: Payer: Self-pay

## 2022-08-30 ENCOUNTER — Encounter (HOSPITAL_COMMUNITY): Payer: Self-pay

## 2022-08-30 NOTE — Pre-Procedure Instructions (Signed)
Attempted pre-op phonecall. Left VM for her to call us back. 

## 2022-08-30 NOTE — Patient Instructions (Signed)
Misty Hughes  08/30/2022     @PREFPERIOPPHARMACY$ @   Your procedure is scheduled on  09/01/2022.   Report to [facility] at  0600 A.M.   Call this number if you have problems the morning of surgery:  [phone]  If you experience any cold or flu symptoms such as cough, fever, chills, shortness of breath, etc. between now and your scheduled surgery, please notify us at the above number.       Your last dose of iron should have been on 08/21/2022.       Your last dose of eliquis should be on 08/30/2022.    Remember:  Follow the diet and prep instructions given to you by the office.     Take these medicines the morning of surgery with A SIP OF WATER           amlodipine, allegra, metoprolol, pantoprazole.     Do not wear jewelry, make-up or nail polish.  Do not wear lotions, powders, or perfumes, or deodorant.  Do not shave 48 hours prior to surgery.  Men may shave face and neck.  Do not bring valuables to the hospital.  [facility] is not responsible for any belongings or valuables.  Contacts, dentures or bridgework may not be worn into surgery.  Leave your suitcase in the car.  After surgery it may be brought to your room.  For patients admitted to the hospital, discharge time will be determined by your treatment team.  Patients discharged the day of surgery will not be allowed to drive home and must have someone with them for 24 hours.    Special instructions:   DO NOT smoke tobacco or vape for 24 hours before your procedure.  Please read over the following fact sheets that you were given. Anesthesia Post-op Instructions and Care and Recovery After Surgery      Colonoscopy, Adult, Care After The following information offers guidance on how to care for yourself after your procedure. Your health care provider may also give you more specific instructions. If you have problems or questions, contact your health care provider. What can I expect after the  procedure? After the procedure, it is common to have: A small amount of blood in your stool for 24 hours after the procedure. Some gas. Mild cramping or bloating of your abdomen. Follow these instructions at home: Eating and drinking  Drink enough fluid to keep your urine pale yellow. Follow instructions from your health care provider about eating or drinking restrictions. Resume your normal diet as told by your health care provider. Avoid heavy or fried foods that are hard to digest. Activity Rest as told by your health care provider. Avoid sitting for a long time without moving. Get up to take short walks every 1-2 hours. This is important to improve blood flow and breathing. Ask for help if you feel weak or unsteady. Return to your normal activities as told by your health care provider. Ask your health care provider what activities are safe for you. Managing cramping and bloating  Try walking around when you have cramps or feel bloated. If directed, apply heat to your abdomen as told by your health care provider. Use the heat source that your health care provider recommends, such as a moist heat pack or a heating pad. Place a towel between your skin and the heat source. Leave the heat on for 20-30 minutes. Remove the heat if your skin turns bright red. This  is especially important if you are unable to feel pain, heat, or cold. You have a greater risk of getting burned. General instructions If you were given a sedative during the procedure, it can affect you for several hours. Do not drive or operate machinery until your health care provider says that it is safe. For the first 24 hours after the procedure: Do not sign important documents. Do not drink alcohol. Do your regular daily activities at a slower pace than normal. Eat soft foods that are easy to digest. Take over-the-counter and prescription medicines only as told by your health care provider. Keep all follow-up visits. This  is important. Contact a health care provider if: You have blood in your stool 2-3 days after the procedure. Get help right away if: You have more than a small spotting of blood in your stool. You have large blood clots in your stool. You have swelling of your abdomen. You have nausea or vomiting. You have a fever. You have increasing pain in your abdomen that is not relieved with medicine. These symptoms may be an emergency. Get help right away. Call 911. Do not wait to see if the symptoms will go away. Do not drive yourself to the hospital. Summary After the procedure, it is common to have a small amount of blood in your stool. You may also have mild cramping and bloating of your abdomen. If you were given a sedative during the procedure, it can affect you for several hours. Do not drive or operate machinery until your health care provider says that it is safe. Get help right away if you have a lot of blood in your stool, nausea or vomiting, a fever, or increased pain in your abdomen. This information is not intended to replace advice given to you by your health care provider. Make sure you discuss any questions you have with your health care provider. Document Revised: 02/16/2021 Document Reviewed: 02/16/2021 Elsevier Patient Education  [place] After The following information offers guidance on how to care for yourself after your procedure. Your health care provider may also give you more specific instructions. If you have problems or questions, contact your health care provider. What can I expect after the procedure? After the procedure, it is common to have: Tiredness. Little or no memory about what happened during or after the procedure. Impaired judgment when it comes to making decisions. Nausea or vomiting. Some trouble with balance. Follow these instructions at home: For the time period you were told by your health care  provider:  Rest. Do not participate in activities where you could fall or become injured. Do not drive or use machinery. Do not drink alcohol. Do not take sleeping pills or medicines that cause drowsiness. Do not make important decisions or sign legal documents. Do not take care of children on your own. Medicines Take over-the-counter and prescription medicines only as told by your health care provider. If you were prescribed antibiotics, take them as told by your health care provider. Do not stop using the antibiotic even if you start to feel better. Eating and drinking Follow instructions from your health care provider about what you may eat and drink. Drink enough fluid to keep your urine pale yellow. If you vomit: Drink clear fluids slowly and in small amounts as you are able. Clear fluids include water, ice chips, low-calorie sports drinks, and fruit juice that has water added to it (diluted fruit juice). Eat light and bland  foods in small amounts as you are able. These foods include bananas, applesauce, rice, lean meats, toast, and crackers. General instructions  Have a responsible adult stay with you for the time you are told. It is important to have someone help care for you until you are awake and alert. If you have sleep apnea, surgery and some medicines can increase your risk for breathing problems. Follow instructions from your health care provider about wearing your sleep device: When you are sleeping. This includes during daytime naps. While taking prescription pain medicines, sleeping medicines, or medicines that make you drowsy. Do not use any products that contain nicotine or tobacco. These products include cigarettes, chewing tobacco, and vaping devices, such as e-cigarettes. If you need help quitting, ask your health care provider. Contact a health care provider if: You feel nauseous or vomit every time you eat or drink. You feel light-headed. You are still sleepy or  having trouble with balance after 24 hours. You get a rash. You have a fever. You have redness or swelling around the IV site. Get help right away if: You have trouble breathing. You have new confusion after you get home. These symptoms may be an emergency. Get help right away. Call 911. Do not wait to see if the symptoms will go away. Do not drive yourself to the hospital. This information is not intended to replace advice given to you by your health care provider. Make sure you discuss any questions you have with your health care provider. Document Revised: 11/21/2021 Document Reviewed: 11/21/2021 Elsevier Patient Education  [place].

## 2022-09-01 ENCOUNTER — Encounter (HOSPITAL_COMMUNITY): Payer: Self-pay | Admitting: Gastroenterology

## 2022-09-01 ENCOUNTER — Ambulatory Visit (HOSPITAL_COMMUNITY): Payer: BC Managed Care – PPO | Admitting: Anesthesiology

## 2022-09-01 ENCOUNTER — Ambulatory Visit (HOSPITAL_BASED_OUTPATIENT_CLINIC_OR_DEPARTMENT_OTHER): Payer: BC Managed Care – PPO | Admitting: Anesthesiology

## 2022-09-01 ENCOUNTER — Ambulatory Visit (HOSPITAL_COMMUNITY)
Admission: RE | Admit: 2022-09-01 | Discharge: 2022-09-01 | Disposition: A | Discharge status: Home / Self Care | Payer: BC Managed Care – PPO | Attending: Gastroenterology | Admitting: Gastroenterology

## 2022-09-01 ENCOUNTER — Encounter (HOSPITAL_COMMUNITY)
Admission: RE | Disposition: A | Discharge status: Home / Self Care | Payer: Self-pay | Source: Home / Self Care | Attending: Gastroenterology

## 2022-09-01 DIAGNOSIS — I10 Essential (primary) hypertension: Secondary | ICD-10-CM

## 2022-09-01 DIAGNOSIS — Z8673 Personal history of transient ischemic attack (TIA), and cerebral infarction without residual deficits: Secondary | ICD-10-CM | POA: Diagnosis not present

## 2022-09-01 DIAGNOSIS — Z1211 Encounter for screening for malignant neoplasm of colon: Secondary | ICD-10-CM | POA: Diagnosis not present

## 2022-09-01 DIAGNOSIS — Z09 Encounter for follow-up examination after completed treatment for conditions other than malignant neoplasm: Secondary | ICD-10-CM | POA: Insufficient documentation

## 2022-09-01 DIAGNOSIS — K5730 Diverticulosis of large intestine without perforation or abscess without bleeding: Secondary | ICD-10-CM | POA: Diagnosis not present

## 2022-09-01 DIAGNOSIS — Z7901 Long term (current) use of anticoagulants: Secondary | ICD-10-CM | POA: Insufficient documentation

## 2022-09-01 DIAGNOSIS — K635 Polyp of colon: Secondary | ICD-10-CM | POA: Diagnosis not present

## 2022-09-01 DIAGNOSIS — D122 Benign neoplasm of ascending colon: Secondary | ICD-10-CM

## 2022-09-01 HISTORY — PX: POLYPECTOMY: SHX5525

## 2022-09-01 HISTORY — PX: COLONOSCOPY WITH PROPOFOL: SHX5780

## 2022-09-01 LAB — HM COLONOSCOPY

## 2022-09-01 SURGERY — COLONOSCOPY WITH PROPOFOL
Anesthesia: General

## 2022-09-01 MED ORDER — PROPOFOL 10 MG/ML IV BOLUS
INTRAVENOUS | Status: DC | PRN
  Administered 2022-09-01: 50 mg via INTRAVENOUS

## 2022-09-01 MED ORDER — LACTATED RINGERS IV SOLN: INTRAVENOUS | Status: DC

## 2022-09-01 MED ORDER — PROPOFOL 500 MG/50ML IV EMUL
INTRAVENOUS | Status: DC | PRN
  Administered 2022-09-01: 150 ug/kg/min via INTRAVENOUS

## 2022-09-01 NOTE — H&P (Signed)
Misty Hughes is an 77 y.o. female.   Chief Complaint: screening colonoscopy HPI: 77 y/o F with MH HTN and stroke, coming for screening colonoscopy. Last colonoscopy 11 years ago, normal. The patient denies having any complaints such as melena, hematochezia, abdominal pain or distention, change in her bowel movement consistency or frequency, no changes in weight recently.  No family history of colorectal cancer.   Past Medical History:  Diagnosis Date   Allergic rhinitis    Hyperlipidemia LDL goal <70    Hypertension    Stroke ([place]) 11/21/2021    History reviewed. No pertinent surgical history.  Family History  Problem Relation Age of Onset   Hypertension Mother    Hypertension Sister    Social History:  reports that she has never smoked. She has never used smokeless tobacco. She reports that she does not drink alcohol and does not use drugs.  Allergies: No Known Allergies  Medications Prior to Admission  Medication Sig Dispense Refill   albuterol (VENTOLIN HFA) 108 (90 Base) MCG/ACT inhaler Inhale 1-2 puffs into the lungs every 6 (six) hours as needed for wheezing or shortness of breath (asthma).     amLODipine (NORVASC) 2.5 MG tablet Take 2.5 mg by mouth daily.     apixaban (ELIQUIS) 5 MG TABS tablet TAKE 1 TABLET(5 MG) BY MOUTH TWICE DAILY (Patient taking differently: Take 5 mg by mouth 2 (two) times daily.) 180 tablet 1   atorvastatin (LIPITOR) 40 MG tablet TAKE 1 TABLET(40 MG) BY MOUTH EVERY EVENING 90 tablet 1   Calcium Carb-Cholecalciferol (CALCIUM + D3 PO) Take 1 tablet by mouth daily.     ferrous sulfate 325 (65 FE) MG tablet Take 325 mg by mouth daily with breakfast. OTC     fexofenadine (ALLEGRA) 180 MG tablet Take 1 tablet (180 mg total) by mouth daily as needed for allergies or rhinitis. (Patient taking differently: Take 180 mg by mouth daily.)     metoprolol tartrate (LOPRESSOR) 25 MG tablet Take 1 tablet (25 mg total) by mouth 2 (two) times daily. (Patient taking  differently: Take 12.5 mg by mouth 2 (two) times daily.) 60 tablet 1   Multiple Vitamin (MULTIVITAMIN WITH MINERALS) TABS tablet Take 1 tablet by mouth daily.     Omega 3-6-9 CAPS Take 1 capsule by mouth daily.     pantoprazole (PROTONIX) 20 MG tablet TAKE 1 TABLET BY MOUTH EVERY DAY WHILE ON ASPIRIN TO PROTECT STOMACH 90 tablet 1   triamterene-hydrochlorothiazide (DYAZIDE) 37.5-25 MG capsule Take 1 each (1 capsule total) by mouth daily.     guaiFENesin-dextromethorphan (ROBITUSSIN DM) 100-10 MG/5ML syrup Take 10 mLs by mouth every 8 (eight) hours. (Patient not taking: Reported on 08/29/2022) 118 mL 0   ibuprofen (ADVIL) 600 MG tablet Take 1 tablet (600 mg total) by mouth every 6 (six) hours as needed for fever, headache, mild pain or cramping. (Patient not taking: Reported on 08/29/2022) 30 tablet 0    No results found for this or any previous visit (from the past 48 hour(s)). No results found.  Review of Systems  All other systems reviewed and are negative.   Blood pressure 138/79, pulse 99, temperature 98.7 F (37.1 C), resp. rate 18, height 5' 1.5" (1.562 m), weight 67.4 kg, SpO2 98 %. Physical Exam  GENERAL: The patient is AO x3, in no acute distress. HEENT: Head is normocephalic and atraumatic. EOMI are intact. Mouth is well hydrated and without lesions. NECK: Supple. No masses LUNGS: Clear to auscultation. No presence of  rhonchi/wheezing/rales. Adequate chest expansion HEART: RRR, normal s1 and s2. ABDOMEN: Soft, nontender, no guarding, no peritoneal signs, and nondistended. BS +. No masses. EXTREMITIES: Without any cyanosis, clubbing, rash, lesions or edema. NEUROLOGIC: AOx3, no focal motor deficit. SKIN: no jaundice, no rashes  Assessment/Plan  77 y/o F with MH HTN and stroke, coming for screening colonoscopy. The patient is at average risk for colorectal cancer.  We will proceed with colonoscopy today.   Harvel Quale, MD 09/01/2022, 7:30 AM

## 2022-09-01 NOTE — Anesthesia Postprocedure Evaluation (Signed)
Anesthesia Post Note  Patient: Corneshia Zelaya  Procedure(s) Performed: COLONOSCOPY WITH PROPOFOL POLYPECTOMY  Patient location during evaluation: Phase II Anesthesia Type: General Level of consciousness: awake and alert and oriented Pain management: pain level controlled Vital Signs Assessment: post-procedure vital signs reviewed and stable Respiratory status: spontaneous breathing, nonlabored ventilation and respiratory function stable Cardiovascular status: blood pressure returned to baseline and stable Postop Assessment: no apparent nausea or vomiting Anesthetic complications: no  No notable events documented.   Last Vitals:  Vitals:   09/01/22 0646 09/01/22 0809  BP: 138/79 120/80  Pulse: 99 (!) 107  Resp:  16  Temp:  37 C  SpO2:  98%    Last Pain:  Vitals:   09/01/22 0809  TempSrc: Oral  PainSc: 0-No pain                 Amillya Chavira C Enes Wegener

## 2022-09-01 NOTE — Op Note (Signed)
[facility] Patient Name: Misty Hughes Procedure Date: 09/01/2022 6:54 AM MRN: [id number] Date of Birth: [birth year] Attending MD: Maylon Peppers , , [id number] CSN: [id number] Age: 77 Admit Type: Outpatient Procedure:                Colonoscopy Indications:              Screening for colorectal malignant neoplasm Providers:                Maylon Peppers, Caprice Kluver, Raphael Gibney,                            Technician Referring MD:              Medicines:                Monitored Anesthesia Care Complications:            No immediate complications. Estimated Blood Loss:     Estimated blood loss: none. Procedure:                Pre-Anesthesia Assessment:                           - Prior to the procedure, a History and Physical                            was performed, and patient medications, allergies                            and sensitivities were reviewed. The patient's                            tolerance of previous anesthesia was reviewed.                           - The risks and benefits of the procedure and the                            sedation options and risks were discussed with the                            patient. All questions were answered and informed                            consent was obtained.                           - ASA Grade Assessment: II - A patient with mild                            systemic disease.                           After obtaining informed consent, the colonoscope                            was passed under direct vision. Throughout the  procedure, the patient's blood pressure, pulse, and                            oxygen saturations were monitored continuously. The                            [id number] scope was introduced through                            the anus and advanced to the the cecum, identified                            by appendiceal orifice and ileocecal valve. The                             colonoscopy was performed without difficulty. The                            patient tolerated the procedure well. The quality                            of the bowel preparation was adequate. Scope In: 7:44:32 AM Scope Out: 8:03:52 AM Scope Withdrawal Time: 0 hours 11 minutes 0 seconds  Total Procedure Duration: 0 hours 19 minutes 20 seconds  Findings:      The perianal and digital rectal examinations were normal.      A 1 mm polyp was found in the ascending colon. The polyp was sessile.       The polyp was removed with a cold biopsy forceps. Resection and       retrieval were complete.      A few medium-mouthed diverticula were found in the sigmoid colon.      The retroflexed view of the distal rectum and anal verge was normal and       showed no anal or rectal abnormalities. Impression:               - One 1 mm polyp in the ascending colon, removed                            with a cold biopsy forceps. Resected and retrieved.                           - Diverticulosis in the sigmoid colon.                           - The distal rectum and anal verge are normal on                            retroflexion view. Moderate Sedation:      Per Anesthesia Care Recommendation:           - Discharge patient to home (ambulatory).                           - Resume previous diet.                           -  Await pathology results.                           - Repeat colonoscopy is not recommended due to                            current age (61 years or older) for screening                            purposes.                           - Restart Eliquis today. Procedure Code(s):        --- Professional ---                           [phone], Colonoscopy, flexible; with biopsy, single                            or multiple Diagnosis Code(s):        --- Professional ---                           Z12.11, Encounter for screening for malignant                             neoplasm of colon                           D12.2, Benign neoplasm of ascending colon                           K57.30, Diverticulosis of large intestine without                            perforation or abscess without bleeding CPT copyright 2022 American [employer] Association. All rights reserved. The codes documented in this report are preliminary and upon coder review may  be revised to meet current compliance requirements. Maylon Peppers, MD Maylon Peppers,  09/01/2022 8:10:05 AM This report has been signed electronically. Number of Addenda: 0

## 2022-09-01 NOTE — Anesthesia Preprocedure Evaluation (Signed)
Anesthesia Evaluation  Patient identified by MRN, date of birth, ID band Patient awake    Reviewed: Allergy & Precautions, H&P , NPO status , Patient's Chart, lab work & pertinent test results  Airway Mallampati: II  TM Distance: >3 FB Neck ROM: Full    Dental  (+) Dental Advisory Given, Upper Dentures   Pulmonary neg pulmonary ROS   Pulmonary exam normal breath sounds clear to auscultation       Cardiovascular hypertension, Pt. on medications + dysrhythmias Atrial Fibrillation  Rhythm:Irregular Rate:Normal     Neuro/Psych CVA, No Residual Symptoms  negative psych ROS   GI/Hepatic negative GI ROS, Neg liver ROS,,,  Endo/Other  negative endocrine ROS    Renal/GU negative Renal ROS  negative genitourinary   Musculoskeletal  (+) Arthritis , Osteoarthritis,    Abdominal   Peds negative pediatric ROS (+)  Hematology negative hematology ROS (+)   Anesthesia Other Findings   Reproductive/Obstetrics negative OB ROS                             Anesthesia Physical Anesthesia Plan  ASA: 3  Anesthesia Plan: General   Post-op Pain Management: Minimal or no pain anticipated   Induction: Intravenous  PONV Risk Score and Plan: 1 and Propofol infusion  Airway Management Planned: Nasal Cannula and Natural Airway  Additional Equipment:   Intra-op Plan:   Post-operative Plan:   Informed Consent: I have reviewed the patients History and Physical, chart, labs and discussed the procedure including the risks, benefits and alternatives for the proposed anesthesia with the patient or authorized representative who has indicated his/her understanding and acceptance.     Dental advisory given  Plan Discussed with: CRNA and Surgeon  Anesthesia Plan Comments:        Anesthesia Quick Evaluation

## 2022-09-01 NOTE — Transfer of Care (Signed)
Immediate Anesthesia Transfer of Care Note  Patient: Misty Hughes  Procedure(s) Performed: COLONOSCOPY WITH PROPOFOL POLYPECTOMY  Patient Location: Short Stay  Anesthesia Type:General  Level of Consciousness: awake, alert , and patient cooperative  Airway & Oxygen Therapy: Patient Spontanous Breathing  Post-op Assessment: Report given to RN, Post -op Vital signs reviewed and stable, and Patient moving all extremities X 4  Post vital signs: Reviewed and stable  Last Vitals:  Vitals Value Taken Time  BP 120/80 09/01/22 0809  Temp 37 C 09/01/22 0809  Pulse 107 09/01/22 0809  Resp 16 09/01/22 0809  SpO2 98 % 09/01/22 0809    Last Pain:  Vitals:   09/01/22 0809  TempSrc: Oral  PainSc: 0-No pain         Complications: No notable events documented.

## 2022-09-01 NOTE — Discharge Instructions (Addendum)
You are being discharged to home.  Resume your previous diet.  We are waiting for your pathology results.  Your physician has indicated that a repeat colonoscopy is not recommended due to your current age (29 years or older) for screening purposes.  Restart Eliquis today.

## 2022-09-04 ENCOUNTER — Encounter (INDEPENDENT_AMBULATORY_CARE_PROVIDER_SITE_OTHER): Payer: Self-pay | Admitting: *Deleted

## 2022-09-05 LAB — SURGICAL PATHOLOGY

## 2022-09-06 ENCOUNTER — Encounter (INDEPENDENT_AMBULATORY_CARE_PROVIDER_SITE_OTHER): Payer: Self-pay | Admitting: *Deleted

## 2022-09-08 ENCOUNTER — Encounter (HOSPITAL_COMMUNITY): Payer: Self-pay | Admitting: Gastroenterology

## 2022-10-17 ENCOUNTER — Emergency Department (HOSPITAL_COMMUNITY)
Admission: EM | Admit: 2022-10-17 | Discharge: 2022-10-18 | Disposition: A | Discharge status: Home / Self Care | Payer: BC Managed Care – PPO | Attending: Emergency Medicine | Admitting: Emergency Medicine

## 2022-10-17 ENCOUNTER — Emergency Department (HOSPITAL_COMMUNITY): Payer: BC Managed Care – PPO

## 2022-10-17 ENCOUNTER — Other Ambulatory Visit: Payer: Self-pay

## 2022-10-17 ENCOUNTER — Encounter (HOSPITAL_COMMUNITY): Payer: Self-pay

## 2022-10-17 DIAGNOSIS — K5900 Constipation, unspecified: Secondary | ICD-10-CM | POA: Diagnosis not present

## 2022-10-17 DIAGNOSIS — Z79899 Other long term (current) drug therapy: Secondary | ICD-10-CM | POA: Insufficient documentation

## 2022-10-17 DIAGNOSIS — R1084 Generalized abdominal pain: Secondary | ICD-10-CM | POA: Diagnosis present

## 2022-10-17 LAB — BASIC METABOLIC PANEL
Anion gap: 10 (ref 5–15)
BUN: 23 mg/dL (ref 8–23)
CO2: 28 mmol/L (ref 22–32)
Calcium: 9.8 mg/dL (ref 8.9–10.3)
Chloride: 101 mmol/L (ref 98–111)
Creatinine, Ser: 0.79 mg/dL (ref 0.44–1.00)
GFR, Estimated: >60 mL/min (ref >60)
Glucose, Bld: 99 mg/dL (ref 70–99)
Potassium: 3.6 mmol/L (ref 3.5–5.1)
Sodium: 139 mmol/L (ref 135–145)

## 2022-10-17 LAB — CBC WITH DIFFERENTIAL/PLATELET
Abs Immature Granulocytes: 0.02 10*3/uL (ref 0.00–0.07)
Basophils Absolute: 0 10*3/uL (ref 0.0–0.1)
Basophils Relative: 0 %
Eosinophils Absolute: 0.1 10*3/uL (ref 0.0–0.5)
Eosinophils Relative: 1 %
HCT: 33.9 % — ABNORMAL LOW (ref 36.0–46.0)
Hemoglobin: 11.6 g/dL — ABNORMAL LOW (ref 12.0–15.0)
Immature Granulocytes: 0 %
Lymphocytes Relative: 15 %
Lymphs Abs: 0.9 10*3/uL (ref 0.7–4.0)
MCH: 31.1 pg (ref 26.0–34.0)
MCHC: 34.2 g/dL (ref 30.0–36.0)
MCV: 90.9 fL (ref 80.0–100.0)
Monocytes Absolute: 0.6 10*3/uL (ref 0.1–1.0)
Monocytes Relative: 10 %
Neutro Abs: 4.3 10*3/uL (ref 1.7–7.7)
Neutrophils Relative %: 74 %
Platelets: 247 10*3/uL (ref 150–400)
RBC: 3.73 MIL/uL — ABNORMAL LOW (ref 3.87–5.11)
RDW: 16.4 % — ABNORMAL HIGH (ref 11.5–15.5)
WBC: 5.9 10*3/uL (ref 4.0–10.5)
nRBC: 0 % (ref 0.0–0.2)

## 2022-10-17 LAB — URINALYSIS, ROUTINE W REFLEX MICROSCOPIC
Bilirubin Urine: NEGATIVE
Glucose, UA: NEGATIVE mg/dL
Hgb urine dipstick: NEGATIVE
Ketones, ur: NEGATIVE mg/dL
Leukocytes,Ua: NEGATIVE
Nitrite: NEGATIVE
Protein, ur: NEGATIVE mg/dL
Specific Gravity, Urine: 1.01 (ref 1.005–1.030)
pH: 7 (ref 5.0–8.0)

## 2022-10-17 LAB — LIPASE, BLOOD: Lipase: 22 U/L (ref 11–51)

## 2022-10-17 NOTE — ED Triage Notes (Signed)
Pt arrived via POV from home c/o sudden onset of abdominal pain that began a few hours PTA. Pt reports seeing her PCP a couple weeks ago and was advised to increase her fiber intake and was told if symptoms did not improve then to seek treatment in the ER. Pt reports normal BM. Denies problems with micturition.

## 2022-10-18 DIAGNOSIS — K5900 Constipation, unspecified: Secondary | ICD-10-CM | POA: Diagnosis not present

## 2022-10-18 MED ORDER — HYOSCYAMINE SULFATE 0.125 MG SL SUBL: 0.1250 mg | SUBLINGUAL_TABLET | SUBLINGUAL | 0 refills | Status: DC | PRN

## 2022-10-18 MED ORDER — POLYETHYLENE GLYCOL 3350 17 G PO PACK: 17.0000 g | PACK | Freq: Every day | ORAL | 0 refills | Status: AC

## 2022-10-18 MED ORDER — IOHEXOL 300 MG/ML  SOLN
75.0000 mL | Freq: Once | INTRAMUSCULAR | Status: AC | PRN
  Administered 2022-10-18: 75 mL via INTRAVENOUS

## 2022-10-18 NOTE — ED Provider Notes (Signed)
[place] EMERGENCY DEPARTMENT AT [facility] Provider Note   CSN: [id number] Arrival date & time: 10/17/22  2043     History  Chief Complaint  Patient presents with   Abdominal Pain    Misty Hughes is a 77 y.o. female.  Patient presents to the emergency department for evaluation of abdominal pain.  Patient reports that she had diffuse sharp, crampy pain for approximately 5 hours today.  By the time she made it back to the exam room, however, the pain is eased off.  She had similar pains several weeks ago and her PCP thought she might be constipated, told her to take fiber which she has been doing.  Patient does not think she is constipated because she has been having bowel movements.       Home Medications Prior to Admission medications   Medication Sig Start Date End Date Taking? Authorizing Provider  hyoscyamine (LEVSIN SL) 0.125 MG SL tablet Place 1 tablet (0.125 mg total) under the tongue every 4 (four) hours as needed for cramping (abdominal pain). 10/18/22  Yes Trecia Maring, Canary Brim, MD  polyethylene glycol (MIRALAX / GLYCOLAX) 17 g packet Take 17 g by mouth daily. 10/18/22  Yes Diquan Kassis, Canary Brim, MD  albuterol (VENTOLIN HFA) 108 (90 Base) MCG/ACT inhaler Inhale 1-2 puffs into the lungs every 6 (six) hours as needed for wheezing or shortness of breath (asthma).    [provider]  amLODipine (NORVASC) 2.5 MG tablet Take 2.5 mg by mouth daily. 11/23/21   [provider]  apixaban (ELIQUIS) 5 MG TABS tablet TAKE 1 TABLET(5 MG) BY MOUTH TWICE DAILY Patient taking differently: Take 5 mg by mouth 2 (two) times daily. 07/03/22   Pricilla Riffle, MD  atorvastatin (LIPITOR) 40 MG tablet TAKE 1 TABLET(40 MG) BY MOUTH EVERY EVENING 07/21/22   Hilty, Lisette Abu, MD  Calcium Carb-Cholecalciferol (CALCIUM + D3 PO) Take 1 tablet by mouth daily.    [provider]  ferrous sulfate 325 (65 FE) MG tablet Take 325 mg by mouth daily with breakfast. OTC     [provider]  fexofenadine (ALLEGRA) 180 MG tablet Take 1 tablet (180 mg total) by mouth daily as needed for allergies or rhinitis. Patient taking differently: Take 180 mg by mouth daily. 11/21/21   [name], [name], MD  guaiFENesin-dextromethorphan (ROBITUSSIN DM) 100-10 MG/5ML syrup Take 10 mLs by mouth every 8 (eight) hours. Patient not taking: Reported on 08/29/2022 08/12/22   Kendell Bane, MD  ibuprofen (ADVIL) 600 MG tablet Take 1 tablet (600 mg total) by mouth every 6 (six) hours as needed for fever, headache, mild pain or cramping. Patient not taking: Reported on 08/29/2022 08/12/22   Kendell Bane, MD  metoprolol tartrate (LOPRESSOR) 25 MG tablet Take 1 tablet (25 mg total) by mouth 2 (two) times daily. Patient taking differently: Take 12.5 mg by mouth 2 (two) times daily. 08/12/22 10/11/22  Kendell Bane, MD  Multiple Vitamin (MULTIVITAMIN WITH MINERALS) TABS tablet Take 1 tablet by mouth daily.    [provider]  Omega 3-6-9 CAPS Take 1 capsule by mouth daily.    [provider]  pantoprazole (PROTONIX) 20 MG tablet TAKE 1 TABLET BY MOUTH EVERY DAY WHILE ON ASPIRIN TO PROTECT STOMACH 08/17/22   Hilty, Lisette Abu, MD  triamterene-hydrochlorothiazide (DYAZIDE) 37.5-25 MG capsule Take 1 each (1 capsule total) by mouth daily. 11/22/21   Cleora Fleet, MD      Allergies    Patient  has no known allergies.    Review of Systems   Review of Systems  Physical Exam Updated Vital Signs BP 135/73 (BP Location: Right Arm)   Pulse 93   Temp 98.4 F (36.9 C) (Oral)   Resp 16   Ht 5' 1.5" (1.562 m)   Wt 67 kg   SpO2 98%   BMI 27.46 kg/m  Physical Exam Vitals and nursing note reviewed.  Constitutional:      General: She is not in acute distress.    Appearance: She is well-developed.  HENT:     Head: Normocephalic and atraumatic.     Mouth/Throat:     Mouth: Mucous membranes are moist.  Eyes:     General: Vision grossly intact. Gaze aligned  appropriately.     Extraocular Movements: Extraocular movements intact.     Conjunctiva/sclera: Conjunctivae normal.  Cardiovascular:     Rate and Rhythm: Normal rate and regular rhythm.     Pulses: Normal pulses.     Heart sounds: Normal heart sounds, S1 normal and S2 normal. No murmur heard.    No friction rub. No gallop.  Pulmonary:     Effort: Pulmonary effort is normal. No respiratory distress.     Breath sounds: Normal breath sounds.  Abdominal:     General: Bowel sounds are normal.     Palpations: Abdomen is soft.     Tenderness: There is no abdominal tenderness. There is no guarding or rebound.     Hernia: No hernia is present.  Musculoskeletal:        General: No swelling.     Cervical back: Full passive range of motion without pain, normal range of motion and neck supple. No spinous process tenderness or muscular tenderness. Normal range of motion.     Right lower leg: No edema.     Left lower leg: No edema.  Skin:    General: Skin is warm and dry.     Capillary Refill: Capillary refill takes less than 2 seconds.     Findings: No ecchymosis, erythema, rash or wound.  Neurological:     General: No focal deficit present.     Mental Status: She is alert and oriented to person, place, and time.     GCS: GCS eye subscore is 4. GCS verbal subscore is 5. GCS motor subscore is 6.     Cranial Nerves: Cranial nerves 2-12 are intact.     Sensory: Sensation is intact.     Motor: Motor function is intact.     Coordination: Coordination is intact.  Psychiatric:        Attention and Perception: Attention normal.        Mood and Affect: Mood normal.        Speech: Speech normal.        Behavior: Behavior normal.     ED Results / Procedures / Treatments   Labs (all labs ordered are listed, but only abnormal results are displayed) Labs Reviewed  CBC WITH DIFFERENTIAL/PLATELET - Abnormal; Notable for the following components:      Result Value   RBC 3.73 (*)    Hemoglobin 11.6  (*)    HCT 33.9 (*)    RDW 16.4 (*)    All other components within normal limits  URINALYSIS, ROUTINE W REFLEX MICROSCOPIC  LIPASE, BLOOD  BASIC METABOLIC PANEL    EKG None  Radiology CT ABDOMEN PELVIS W CONTRAST  Result Date: 10/18/2022 CLINICAL DATA:  Sudden onset of abdominal pain. EXAM:  CT ABDOMEN AND PELVIS WITH CONTRAST TECHNIQUE: Multidetector CT imaging of the abdomen and pelvis was performed using the standard protocol following bolus administration of intravenous contrast. RADIATION DOSE REDUCTION: This exam was performed according to the departmental dose-optimization program which includes automated exposure control, adjustment of the mA and/or kV according to patient size and/or use of iterative reconstruction technique. CONTRAST:  18mL OMNIPAQUE IOHEXOL 300 MG/ML  SOLN COMPARISON:  None Available. FINDINGS: Lower chest: Heart is enlarged. Scattered opacities are noted at the lung bases, possible atelectasis or infiltrate. Hepatobiliary: There is a 2.1 cm hypodensity in the left lobe of the liver with peripheral puddling of contrast suggesting hemangioma. No biliary ductal dilatation. The gallbladder is without stones. Pancreas: Unremarkable. No pancreatic ductal dilatation or surrounding inflammatory changes. Spleen: Normal in size without focal abnormality. Adrenals/Urinary Tract: The adrenal glands are within normal limits. Multifocal renal cortical scarring is noted bilaterally. The kidneys enhance symmetrically. No renal calculus or hydronephrosis. The bladder is unremarkable. Stomach/Bowel: Stomach is within normal limits. Appendix appears normal. No evidence of bowel wall thickening, distention, or inflammatory changes. No free air or pneumatosis. Scattered diverticula are noted along the sigmoid colon without evidence of diverticulitis. A moderate amount of retained stool is present in the colon. Vascular/Lymphatic: Aortic atherosclerosis. No enlarged abdominal or pelvic lymph  nodes. Reproductive: Calcified fibroids are noted in the uterus. No adnexal mass. Other: Left inguinal hernia containing fat and fluid. Bowel is noted at the entrance of the hernia without evidence of obstruction. Musculoskeletal: Degenerative changes are present in the thoracolumbar spine. No acute osseous abnormality. IMPRESSION: 1. No acute intra-abdominal process. 2. Moderate amount of retained stool in the colon, compatible with constipation. 3. Sigmoid diverticulosis without diverticulitis. 4. Left inguinal hernia containing a small amount free fluid. 5. Hypodense lesion in the left lobe of the liver, compatible with hemangioma. 6. Cardiomegaly. 7. Aortic atherosclerosis. Electronically Signed   By: Thornell Sartorius M.D.   On: 10/18/2022 03:12    Procedures Procedures    Medications Ordered in ED Medications  iohexol (OMNIPAQUE) 300 MG/ML solution 75 mL (75 mLs Intravenous Contrast Given 10/18/22 0244)    ED Course/ Medical Decision Making/ A&P                             Medical Decision Making  Differential Diagnosis considered includes, but not limited to: Cholelithiasis; cholecystitis; cholangitis; bowel obstruction; esophagitis; gastritis; peptic ulcer disease; pancreatitis; constipation.  Patient reports sharp, stabbing and crampy pain that has been intermittent.  By the time I examined her the pain had resolved.  Abdominal exam is benign.  Lab work was unremarkable.  She had a CAT scan ordered at time of triage.  It was felt that this would be helpful to delineate cause of her pain, rule out serious conditions.  No acute pathology was noted but she was noted to have a large stool burden.  Will treat with Levsin, MiraLAX, follow-up with PCP.         Final Clinical Impression(s) / ED Diagnoses Final diagnoses:  Constipation, unspecified constipation type    Rx / DC Orders ED Discharge Orders          Ordered    hyoscyamine (LEVSIN SL) 0.125 MG SL tablet  Every 4 hours PRN         10/18/22 0342    polyethylene glycol (MIRALAX / GLYCOLAX) 17 g packet  Daily        10/18/22  [id number]              Gilda CreasePollina, Tavarion Babington J, MD 10/18/22 [phone]

## 2022-12-13 ENCOUNTER — Emergency Department (HOSPITAL_COMMUNITY): Payer: BC Managed Care – PPO

## 2022-12-13 ENCOUNTER — Emergency Department (HOSPITAL_COMMUNITY)
Admission: EM | Admit: 2022-12-13 | Discharge: 2022-12-14 | Disposition: A | Discharge status: Home / Self Care | Payer: BC Managed Care – PPO | Attending: Emergency Medicine | Admitting: Emergency Medicine

## 2022-12-13 ENCOUNTER — Other Ambulatory Visit: Payer: Self-pay

## 2022-12-13 ENCOUNTER — Encounter (HOSPITAL_COMMUNITY): Payer: Self-pay | Admitting: *Deleted

## 2022-12-13 DIAGNOSIS — I10 Essential (primary) hypertension: Secondary | ICD-10-CM | POA: Diagnosis not present

## 2022-12-13 DIAGNOSIS — Z79899 Other long term (current) drug therapy: Secondary | ICD-10-CM | POA: Insufficient documentation

## 2022-12-13 DIAGNOSIS — Z7901 Long term (current) use of anticoagulants: Secondary | ICD-10-CM | POA: Diagnosis not present

## 2022-12-13 DIAGNOSIS — Z8673 Personal history of transient ischemic attack (TIA), and cerebral infarction without residual deficits: Secondary | ICD-10-CM | POA: Diagnosis not present

## 2022-12-13 DIAGNOSIS — R600 Localized edema: Secondary | ICD-10-CM | POA: Diagnosis not present

## 2022-12-13 DIAGNOSIS — R2242 Localized swelling, mass and lump, left lower limb: Secondary | ICD-10-CM | POA: Diagnosis present

## 2022-12-13 LAB — CBC WITH DIFFERENTIAL/PLATELET
Abs Immature Granulocytes: 0.04 10*3/uL (ref 0.00–0.07)
Basophils Absolute: 0 10*3/uL (ref 0.0–0.1)
Basophils Relative: 0 %
Eosinophils Absolute: 0 10*3/uL (ref 0.0–0.5)
Eosinophils Relative: 0 %
HCT: 31.4 % — ABNORMAL LOW (ref 36.0–46.0)
Hemoglobin: 11 g/dL — ABNORMAL LOW (ref 12.0–15.0)
Immature Granulocytes: 0 %
Lymphocytes Relative: 12 %
Lymphs Abs: 1.3 10*3/uL (ref 0.7–4.0)
MCH: 31.7 pg (ref 26.0–34.0)
MCHC: 35 g/dL (ref 30.0–36.0)
MCV: 90.5 fL (ref 80.0–100.0)
Monocytes Absolute: 0.9 10*3/uL (ref 0.1–1.0)
Monocytes Relative: 8 %
Neutro Abs: 8.8 10*3/uL — ABNORMAL HIGH (ref 1.7–7.7)
Neutrophils Relative %: 80 %
Platelets: 196 10*3/uL (ref 150–400)
RBC: 3.47 MIL/uL — ABNORMAL LOW (ref 3.87–5.11)
RDW: 14.5 % (ref 11.5–15.5)
WBC: 11 10*3/uL — ABNORMAL HIGH (ref 4.0–10.5)
nRBC: 0 % (ref 0.0–0.2)

## 2022-12-13 LAB — COMPREHENSIVE METABOLIC PANEL
ALT: 58 U/L — ABNORMAL HIGH (ref 0–44)
AST: 45 U/L — ABNORMAL HIGH (ref 15–41)
Albumin: 3.9 g/dL (ref 3.5–5.0)
Alkaline Phosphatase: 74 U/L (ref 38–126)
Anion gap: 11 (ref 5–15)
BUN: 12 mg/dL (ref 8–23)
CO2: 25 mmol/L (ref 22–32)
Calcium: 9.3 mg/dL (ref 8.9–10.3)
Chloride: 100 mmol/L (ref 98–111)
Creatinine, Ser: 0.67 mg/dL (ref 0.44–1.00)
GFR, Estimated: >60 mL/min (ref >60)
Glucose, Bld: 88 mg/dL (ref 70–99)
Potassium: 3.3 mmol/L — ABNORMAL LOW (ref 3.5–5.1)
Sodium: 136 mmol/L (ref 135–145)
Total Bilirubin: 2.2 mg/dL — ABNORMAL HIGH (ref 0.3–1.2)
Total Protein: 6.8 g/dL (ref 6.5–8.1)

## 2022-12-13 LAB — BRAIN NATRIURETIC PEPTIDE: B Natriuretic Peptide: 338 pg/mL — ABNORMAL HIGH (ref 0.0–100.0)

## 2022-12-13 NOTE — ED Triage Notes (Signed)
Pt c/o bilateral feet swelling x 3 weeks; pt was sent here by her PCP to have testing done

## 2022-12-13 NOTE — Discharge Instructions (Signed)
Workup here without any acute findings.  Starting medications as per your primary care doctor and make an appointment to follow-up with your cardiologist.  Return for any new or worse symptoms.

## 2022-12-13 NOTE — ED Provider Notes (Addendum)
EMERGENCY DEPARTMENT AT [facility] Provider Note   CSN: [id number] Arrival date & time: 12/13/22  1744     History  Chief Complaint  Patient presents with   Leg Swelling    Misty Hughes is a 77 y.o. female.   patient seen by primary care doctor today.  Noticed that she has had bilateral lower extremity swelling for about 3 weeks.  Maybe some mild shortness of breath but no chest pain.  Patient is followed by [facility] heart care Dr. Tenny Craw.  Patient has a history of CVA hypertension hyperlipidemia and atrial fibrillation is on Eliquis for that.  Echo left ventricular ejection fraction was 55 to 60% and had some severe biatrial enlargement.  Had some valve problems.  But right heart function was considered to be normal at the last time she was seen by them which was June 09, 2022.  She is due to follow-up with Dr. Tenny Craw again this month.  He had ordered outpatient chest x-ray outpatient labs and outpatient echocardiogram.  Patient got confused and the outpatient stuff was closed at [facility] so she came here to get that stuff done.  Patient not in any extremis.  Patient's oxygen saturation here is 90% percent on room air temp was 99.9 heart rate 97 respiration 20 blood pressure 153/85.  Primary care doctor started on Lasix and decreased her Dyazide.       Home Medications Prior to Admission medications   Medication Sig Start Date End Date Taking? Authorizing Provider  albuterol (VENTOLIN HFA) 108 (90 Base) MCG/ACT inhaler Inhale 1-2 puffs into the lungs every 6 (six) hours as needed for wheezing or shortness of breath (asthma).    [provider]  amLODipine (NORVASC) 2.5 MG tablet Take 2.5 mg by mouth daily. 11/23/21   [provider]  apixaban (ELIQUIS) 5 MG TABS tablet TAKE 1 TABLET(5 MG) BY MOUTH TWICE DAILY Patient taking differently: Take 5 mg by mouth 2 (two) times daily. 07/03/22   Pricilla Riffle, MD  atorvastatin (LIPITOR) 40 MG tablet TAKE 1  TABLET(40 MG) BY MOUTH EVERY EVENING 07/21/22   Hilty, Lisette Abu, MD  Calcium Carb-Cholecalciferol (CALCIUM + D3 PO) Take 1 tablet by mouth daily.    [provider]  ferrous sulfate 325 (65 FE) MG tablet Take 325 mg by mouth daily with breakfast. OTC    [provider]  fexofenadine (ALLEGRA) 180 MG tablet Take 1 tablet (180 mg total) by mouth daily as needed for allergies or rhinitis. Patient taking differently: Take 180 mg by mouth daily. 11/21/21   [name], [name], MD  guaiFENesin-dextromethorphan (ROBITUSSIN DM) 100-10 MG/5ML syrup Take 10 mLs by mouth every 8 (eight) hours. Patient not taking: Reported on 08/29/2022 08/12/22   Kendell Bane, MD  hyoscyamine (LEVSIN SL) 0.125 MG SL tablet Place 1 tablet (0.125 mg total) under the tongue every 4 (four) hours as needed for cramping (abdominal pain). 10/18/22   Gilda Crease, MD  ibuprofen (ADVIL) 600 MG tablet Take 1 tablet (600 mg total) by mouth every 6 (six) hours as needed for fever, headache, mild pain or cramping. Patient not taking: Reported on 08/29/2022 08/12/22   Kendell Bane, MD  metoprolol tartrate (LOPRESSOR) 25 MG tablet Take 1 tablet (25 mg total) by mouth 2 (two) times daily. Patient taking differently: Take 12.5 mg by mouth 2 (two) times daily. 08/12/22 10/11/22  Kendell Bane, MD  Multiple Vitamin (MULTIVITAMIN WITH MINERALS) TABS tablet Take 1 tablet by  mouth daily.    [provider]  Omega 3-6-9 CAPS Take 1 capsule by mouth daily.    [provider]  pantoprazole (PROTONIX) 20 MG tablet TAKE 1 TABLET BY MOUTH EVERY DAY WHILE ON ASPIRIN TO PROTECT STOMACH 08/17/22   Hilty, Lisette Abu, MD  polyethylene glycol (MIRALAX / GLYCOLAX) 17 g packet Take 17 g by mouth daily. 10/18/22   Gilda Crease, MD  triamterene-hydrochlorothiazide (DYAZIDE) 37.5-25 MG capsule Take 1 each (1 capsule total) by mouth daily. 11/22/21   Cleora Fleet, MD      Allergies    Patient has no  known allergies.    Review of Systems   Review of Systems  Constitutional:  Negative for chills and fever.  HENT:  Negative for ear pain and sore throat.   Eyes:  Negative for pain and visual disturbance.  Respiratory:  Positive for shortness of breath. Negative for cough.   Cardiovascular:  Positive for leg swelling. Negative for chest pain and palpitations.  Gastrointestinal:  Negative for abdominal pain and vomiting.  Genitourinary:  Negative for dysuria and hematuria.  Musculoskeletal:  Negative for arthralgias and back pain.  Skin:  Negative for color change and rash.  Neurological:  Negative for seizures and syncope.  All other systems reviewed and are negative.   Physical Exam Updated Vital Signs BP (!) 153/85 (BP Location: Right Arm)   Pulse 97   Temp 99.9 F (37.7 C) (Oral)   Resp 20   Ht 1.575 m (5\' 2" )   Wt 59.4 kg   SpO2 97%   BMI 23.96 kg/m  Physical Exam Vitals and nursing note reviewed.  Constitutional:      General: She is not in acute distress.    Appearance: Normal appearance. She is well-developed.  HENT:     Head: Normocephalic and atraumatic.  Eyes:     Extraocular Movements: Extraocular movements intact.     Conjunctiva/sclera: Conjunctivae normal.     Pupils: Pupils are equal, round, and reactive to light.  Cardiovascular:     Rate and Rhythm: Normal rate and regular rhythm.     Heart sounds: No murmur heard. Pulmonary:     Effort: Pulmonary effort is normal. No respiratory distress.     Breath sounds: Normal breath sounds.  Abdominal:     Palpations: Abdomen is soft.     Tenderness: There is no abdominal tenderness.  Musculoskeletal:        General: No swelling.     Cervical back: Normal range of motion and neck supple.     Right lower leg: Edema present.     Left lower leg: Edema present.  Skin:    General: Skin is warm and dry.     Capillary Refill: Capillary refill takes less than 2 seconds.  Neurological:     General: No focal  deficit present.     Mental Status: She is alert and oriented to person, place, and time.  Psychiatric:        Mood and Affect: Mood normal.     ED Results / Procedures / Treatments   Labs (all labs ordered are listed, but only abnormal results are displayed) Labs Reviewed  CBC WITH DIFFERENTIAL/PLATELET - Abnormal; Notable for the following components:      Result Value   WBC 11.0 (*)    RBC 3.47 (*)    Hemoglobin 11.0 (*)    HCT 31.4 (*)    Neutro Abs 8.8 (*)    All other  components within normal limits  BRAIN NATRIURETIC PEPTIDE  COMPREHENSIVE METABOLIC PANEL    EKG None  Radiology No results found.  Procedures Procedures    Medications Ordered in ED Medications - No data to display  ED Course/ Medical Decision Making/ A&P                             Medical Decision Making Amount and/or Complexity of Data Reviewed Labs: ordered. Radiology: ordered.    patient nontoxic no acute distress here.  Will do basic labs will get chest x-ray. Recommend that patient follow-up with her cardiologist this month.  Will not get any troponin since patient not having any chest pain.  Patient's chest x-ray here tonight with no active cardiopulmonary disease no evidence of any pulmonary edema.  EKG without any acute findings CBC white count 11 hemoglobin 11 platelets were 196 complete metabolic panel potassium a little low at 3.3 renal function normal with GFR greater than 60 patient does have a little bit of elevated total bili had that in the past.  Electrolytes otherwise without significant abnormalities BNP up a little bit at 338 her primary care doctor can kind to trend that he is already started her on Lasix and cardiology can trend that as well.   Final Clinical Impression(s) / ED Diagnoses Final diagnoses:  Leg edema    Rx / DC Orders ED Discharge Orders     None         Vanetta Mulders, MD 12/13/22 [id number]    Vanetta Mulders, MD 12/13/22 [phone]

## 2023-01-03 ENCOUNTER — Other Ambulatory Visit: Payer: Self-pay | Admitting: Internal Medicine

## 2023-01-03 DIAGNOSIS — I4891 Unspecified atrial fibrillation: Secondary | ICD-10-CM

## 2023-01-03 NOTE — Telephone Encounter (Signed)
Prescription refill request for Eliquis received. Indication: Afib/CVA Last office visit: 06/09/22 Tenny Craw)  Scr: 0.67 (12/13/22)  Age: 77 Weight: 59.4kg  Appropriate dose. Refill sent.

## 2023-01-08 ENCOUNTER — Telehealth: Payer: Self-pay | Admitting: Diagnostic Neuroimaging

## 2023-01-08 NOTE — Telephone Encounter (Signed)
Received sleep referral for patient from Dr. Allena Katz at [facility] at [facility] for severe pulmonary hypertension found on echo, eval for OSA. Placed in sleep referrals box

## 2023-01-15 ENCOUNTER — Telehealth: Payer: Self-pay | Admitting: Internal Medicine

## 2023-01-15 NOTE — Telephone Encounter (Signed)
Spoke to the pt daughter Judeth Cornfield who is not listed on the pt DPR. Advised pt daughter the office is not allowed to speak with her pertaining to pt healthcare. Pt daughter voiced understanding.   Spoke to the pt, on 12/27/22 had OV with PCP, was advised by the nurse she may need sooner appointment with cardiologist due to her lab results. However, pet Dr. Allena Katz notes :   The patient's symptoms have now resolved on furosemide. History of chronic A-fib on Eliquis. Unconcerning EKG at the last visit. Advised to get labs and blood work completed however she went to the emergency department at [facility] health. There were no changes to her medications or change in disposition based on her emergency department visit. Her labs were completed. Her BNP was slightly elevated in the 300s. Her symptoms have resolved with utilization of furosemide. She is advised to continue her other blood pressure medications. Should she require furosemide again due to lower extremity swelling again it may be advisable to be on furosemide more consistently. Her last echo was reviewed from 1 year ago and is relatively unremarkable. A new echo is pending. She follows with Dr. Tenny Craw. Her chest x-ray from the ER was unremarkable. Patient counseled on when to seek further medical attention.    Pt is has scheduled appointment on 04/19/23 with Dr. Tenny Craw. Advised pt note on   7/1 per Neurology: Received sleep referral for patient from Dr. Allena Katz at [facility] at [facility] for severe pulmonary hypertension found on echo, eval for OSA  Advised pt  MD and nurse are currently not in the office. Pt voiced understanding. Will forward to MD and nurse for advise.

## 2023-01-15 NOTE — Telephone Encounter (Signed)
Patient's daughter is requesting call back to go over results from a test she had done recently. She states they have been told from PCP and referring office that patient would need to see Dr. Tenny Craw before her 10/10 appt. Please advise.

## 2023-01-16 NOTE — Telephone Encounter (Signed)
Very minimal elevation of BNP   Treated with lasix I would keep appt she has in October if breathing stable

## 2023-01-17 NOTE — Telephone Encounter (Signed)
Pt's daughter called in to check on referral. Let her know we did receive it, but our sleep referral coordinator is out of the office this week, she should be giving her a call back next week to schedule. She voiced concern that this was more on the urgent side of things, I advised if it is urgent I would reach back out to referring provider and have them refer patient elsewhere that can get her in urgently. I also let her know that sleep consult next availability is 8/19. She is going to wait for [place] to call next week

## 2023-01-17 NOTE — Telephone Encounter (Signed)
Pts daughter advised and will keep an eye on her and let us know if she needs [place] prior to her Oct appt... she does not have any lower extremity edema and she is breathing well.. she will keep her legs elevated when sitting, wear compression stockings during the day only and watch her NA intake.

## 2023-01-17 NOTE — Telephone Encounter (Signed)
Follow Up:       Daughter is calling back to find out what was decided.

## 2023-01-23 ENCOUNTER — Encounter: Payer: Self-pay | Admitting: *Deleted

## 2023-01-24 ENCOUNTER — Encounter: Payer: Self-pay | Admitting: Neurology

## 2023-01-24 ENCOUNTER — Ambulatory Visit (INDEPENDENT_AMBULATORY_CARE_PROVIDER_SITE_OTHER): Payer: Medicare Other | Admitting: Neurology

## 2023-01-24 VITALS — BP 133/76 | HR 62 | Ht 61.0 in | Wt 138.6 lb

## 2023-01-24 DIAGNOSIS — I482 Chronic atrial fibrillation, unspecified: Secondary | ICD-10-CM | POA: Diagnosis not present

## 2023-01-24 DIAGNOSIS — Z9189 Other specified personal risk factors, not elsewhere classified: Secondary | ICD-10-CM

## 2023-01-24 DIAGNOSIS — I517 Cardiomegaly: Secondary | ICD-10-CM

## 2023-01-24 DIAGNOSIS — E663 Overweight: Secondary | ICD-10-CM

## 2023-01-24 DIAGNOSIS — I272 Pulmonary hypertension, unspecified: Secondary | ICD-10-CM | POA: Diagnosis not present

## 2023-01-24 NOTE — Patient Instructions (Signed)

## 2023-01-24 NOTE — Progress Notes (Signed)
Subjective:    Patient ID: Misty Hughes is a 77 y.o. female.  HPI    Huston Foley, MD, PhD [facility] [address], [address] [address] [place], [place] [address]  Dear Dr. Allena Katz,  I saw your patient, Misty Hughes, upon your kind request in my sleep clinic today for initial consultation of her sleep disorder, in particular, concern for underlying obstructive sleep apnea.  The patient is accompanied by her husband today.  As you know, Ms. Misty Hughes is a 77 year old female with an underlying complex medical history of hypertension, hyperlipidemia, stroke, atrial fibrillation on Eliquis, lower extremity edema, biatrial enlargement, mild mitral valve regurgitation, moderate tricuspid regurgitation, small PFO, and pulmonary hypertension, who reports sleep disruption.  History is supplemented by her husband who reports that she sleeps throughout the day off-and-on, she typically sleeps on the couch in the den with the TV on.  She does not sleep at night and when she goes to bed at night for example around 11 PM she will be up by 1 AM and cannot go back to sleep.  She prefers to sleep during the day.  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  Her husband does not notice any snoring or apneas.  She does not have a scheduled bedtime or rise time.  She denies nocturia or recurrent morning or nighttime headaches.  She is not aware of any family history of sleep apnea.  She is familiar with sleep apnea as her husband has a CPAP machine.  She lives with her husband and adult grandson. Her Epworth sleepiness score is 0 out of 24, fatigue severity score is 9 out of 63.  She does not drink caffeine daily, she does not drink alcohol or smoke cigarettes. I reviewed your office note from 12/27/2022.  She presented to the emergency room on 12/13/2022 with leg edema.  I reviewed the emergency room records.  Her Past Medical History Is Significant For: Past Medical  History:  Diagnosis Date   Allergic rhinitis    Hyperlipidemia LDL goal <70    Hypertension    Stroke (HCC) 11/21/2021    Her Past Surgical History Is Significant For: Past Surgical History:  Procedure Laterality Date   COLONOSCOPY WITH PROPOFOL N/A 09/01/2022   Procedure: COLONOSCOPY WITH PROPOFOL;  Surgeon: Dolores Frame, MD;  [facility];  Service: Gastroenterology;  Laterality: N/A;  7:30am, asa 3   POLYPECTOMY  09/01/2022   Procedure: POLYPECTOMY;  Surgeon: Dolores Frame, MD;  [facility];  Service: Gastroenterology;;    Her Family History Is Significant For: Family History  Problem Relation Age of Onset   Hypertension Mother    Hypertension Sister    Sleep apnea Neg Hx     Her Social History Is Significant For: Social History   Socioeconomic History   Marital status: Married    Spouse name: Not on file   Number of children: Not on file   Years of education: Not on file   Highest education level: Not on file  Occupational History   Not on file  Tobacco Use   Smoking status: Never   Smokeless tobacco: Never  Vaping Use   Vaping status: Never Used  Substance and Sexual Activity   Alcohol use: Never   Drug use: Never   Sexual activity: Yes  Other Topics Concern   Not on file  Social History Narrative   Not on file  Social Determinants of Health   Financial Resource Strain: Low Risk  (10/19/2022)   Received from [facility], [facility]   Overall Financial Resource Strain (CARDIA)    Difficulty of Paying Living Expenses: Not hard at all  Food Insecurity: No Food Insecurity (10/19/2022)   Received from [facility], [facility]   Hunger Vital Sign    Worried About Running Out of Food in the Last Year: Never true    Ran Out of Food in the Last Year: Never true  Transportation Needs: No Transportation Needs (10/19/2022)   Received from [facility], [facility]   [facility] - Transportation     Lack of Transportation (Medical): No    Lack of Transportation (Non-Medical): No  Physical Activity: Not on file  Stress: No Stress Concern Present (10/19/2022)   Received from [facility], [facility] - Occupational Stress Questionnaire    Feeling of Stress : Not at all  Social Connections: Not on file    Her Allergies Are:  No Known Allergies:   Her Current Medications Are:  Outpatient Encounter Medications as of 01/24/2023  Medication Sig   albuterol (VENTOLIN HFA) 108 (90 Base) MCG/ACT inhaler Inhale 1-2 puffs into the lungs every 6 (six) hours as needed for wheezing or shortness of breath (asthma).   amLODipine (NORVASC) 2.5 MG tablet Take 2.5 mg by mouth daily.   atorvastatin (LIPITOR) 40 MG tablet TAKE 1 TABLET(40 MG) BY MOUTH EVERY EVENING   Calcium Carb-Cholecalciferol (CALCIUM + D3 PO) Take 1 tablet by mouth daily.   ELIQUIS 5 MG TABS tablet TAKE 1 TABLET(5 MG) BY MOUTH TWICE DAILY   ferrous sulfate 325 (65 FE) MG tablet Take 325 mg by mouth daily with breakfast. OTC   fexofenadine (ALLEGRA) 180 MG tablet Take 1 tablet (180 mg total) by mouth daily as needed for allergies or rhinitis. (Patient taking differently: Take 180 mg by mouth daily.)   Furosemide (LASIX PO) Take by mouth daily.   guaiFENesin-dextromethorphan (ROBITUSSIN DM) 100-10 MG/5ML syrup Take 10 mLs by mouth every 8 (eight) hours.   hyoscyamine (LEVSIN SL) 0.125 MG SL tablet Place 1 tablet (0.125 mg total) under the tongue every 4 (four) hours as needed for cramping (abdominal pain).   ibuprofen (ADVIL) 600 MG tablet Take 1 tablet (600 mg total) by mouth every 6 (six) hours as needed for fever, headache, mild pain or cramping.   Multiple Vitamin (MULTIVITAMIN WITH MINERALS) TABS tablet Take 1 tablet by mouth daily.   Omega 3-6-9 CAPS Take 1 capsule by mouth daily.   pantoprazole (PROTONIX) 20 MG tablet TAKE 1 TABLET BY MOUTH EVERY DAY WHILE ON ASPIRIN TO PROTECT  STOMACH   polyethylene glycol (MIRALAX / GLYCOLAX) 17 g packet Take 17 g by mouth daily.   Psyllium (METAMUCIL PO) Take by mouth.   triamterene-hydrochlorothiazide (DYAZIDE) 37.5-25 MG capsule Take 1 each (1 capsule total) by mouth daily.   metoprolol tartrate (LOPRESSOR) 25 MG tablet Take 1 tablet (25 mg total) by mouth 2 (two) times daily. (Patient taking differently: Take 12.5 mg by mouth 2 (two) times daily.)   No facility-administered encounter medications on file as of 01/24/2023.  :   Review of Systems:  Out of a complete 14 point review of systems, all are reviewed and negative with the exception of these symptoms as listed below:   Review of Systems  Neurological:        Pt  here for sleep consult   Pt has hx of strokes Pt denies fatigue,snoring,headaches,hypertension,sleep study,CPAP machine    ESS:0 FSS:0    Objective:  Neurological Exam  Physical Exam Physical Examination:   Vitals:   01/24/23 1031  BP: 133/76  Pulse: 62    General Examination: The patient is a very pleasant 77 y.o. female in no acute distress. She appears well-developed and well-nourished and well groomed.   HEENT: Normocephalic, atraumatic, pupils are equal, round and reactive to light, extraocular tracking is good without limitation to gaze excursion or nystagmus noted. Hearing is grossly intact. Face is symmetric with normal facial animation. Speech is clear with no dysarthria noted. There is no hypophonia. There is no lip, neck/head, jaw or voice tremor. Neck is supple with full range of passive and active motion. There are no carotid bruits on auscultation. Oropharynx exam reveals: mild mouth dryness, adequate dental hygiene and mild airway crowding, due to small airway entry and redundant soft palate, tonsils absent, neck circumference 12 1/8 inches, minimal overbite noted.  Tongue protrudes centrally and palate elevates symmetrically.  Chest: Clear to auscultation without wheezing, rhonchi or  crackles noted.  Heart: S1+S2+0, no particularly irregular, no murmur.   Abdomen: Soft, non-tender and non-distended.  Extremities: There is trace pitting edema in the distal lower extremities bilaterally.   Skin: Warm and dry without trophic changes noted.   Musculoskeletal: exam reveals no obvious joint deformities.   Neurologically:  Mental status: The patient is awake, alert and oriented in all 4 spheres. Her immediate and remote memory, attention, language skills and fund of knowledge are appropriate. There is no evidence of aphasia, agnosia, apraxia or anomia. Speech is clear with normal prosody and enunciation. Thought process is linear. Mood is normal and affect is normal.  Cranial nerves II - XII are as described above under HEENT exam.  Motor exam: Normal bulk, strength and tone is noted. There is no obvious action or resting tremor.  Fine motor skills and coordination: grossly intact.  Cerebellar testing: No dysmetria or intention tremor. There is no truncal or gait ataxia.  Sensory exam: intact to light touch in the upper and lower extremities.  Gait, station and balance: She stands easily. No veering to one side is noted. No leaning to one side is noted. Posture is age-appropriate and stance is narrow based. Gait shows normal stride length and normal pace. No problems turning are noted.   Assessment and Plan:  In summary, Coreena Rubalcava is a very pleasant 77 y.o.-year old female with an underlying complex medical history of hypertension, hyperlipidemia, stroke, atrial fibrillation on Eliquis, lower extremity edema, biatrial enlargement, mild mitral valve regurgitation, moderate tricuspid regurgitation, small PFO, and pulmonary hypertension, whose history and physical exam are concerning for sleep disordered breathing, particularly obstructive sleep apnea (OSA). A laboratory attended sleep study is typically considered "gold standard" for evaluation of sleep disordered breathing.    I had a long chat with the patient and her husband about my findings and the diagnosis of sleep apnea, particularly OSA, its prognosis and treatment options. We talked about medical/conservative treatments, surgical interventions and non-pharmacological approaches for symptom control. I explained, in particular, the risks and ramifications of untreated moderate to severe OSA, especially with respect to developing cardiovascular disease down the road, including congestive heart failure (CHF), difficult to treat hypertension, cardiac arrhythmias (particularly A-fib), neurovascular complications including TIA, stroke and dementia. Even type 2 diabetes has, in part, been linked to untreated OSA. Symptoms of untreated OSA may  include (but may not be limited to) daytime sleepiness, nocturia (i.e. frequent nighttime urination), memory problems, mood irritability and suboptimally controlled or worsening mood disorder such as depression and/or anxiety, lack of energy, lack of motivation, physical discomfort, as well as recurrent headaches, especially morning or nocturnal headaches. We talked about the importance of maintaining a healthy lifestyle and striving for healthy weight.  In addition, we talked about the importance of striving for and maintaining good sleep hygiene.  She is advised to work on maintaining a set sleep schedule and avoiding screen time while sleeping. I recommended a sleep study at this time.  She would prefer a daytime sleep study as she does not sleep during the night.  I outlined the differences between a laboratory attended sleep study which is considered more comprehensive and accurate over the option of a home sleep test (HST); the latter may lead to underestimation of sleep disordered breathing in some instances and does not help with diagnosing upper airway resistance syndrome and is not accurate enough to diagnose primary central sleep apnea typically. I outlined possible surgical and  non-surgical treatment options of OSA, including the use of a positive airway pressure (PAP) device (i.e. CPAP, AutoPAP/APAP or BiPAP in certain circumstances), a custom-made dental device (aka oral appliance, which would require a referral to a specialist dentist or orthodontist typically, and is generally speaking not considered for patients with full dentures or edentulous state), upper airway surgical options, such as traditional UPPP (which is not considered a first-line treatment) or the Inspire device (hypoglossal nerve stimulator, which would involve a referral for consultation with an ENT surgeon, after careful selection, following inclusion criteria - also not first-line treatment). I explained the PAP treatment option to the patient in detail, as this is generally considered first-line treatment.  The patient indicated that she would be willing to try PAP therapy, if the need arises. I explained the importance of being compliant with PAP treatment, not only for insurance purposes but primarily to improve patient's symptoms symptoms, and for the patient's long term health benefit, including to reduce Her cardiovascular risks longer-term.    We will pick up our discussion about the next steps and treatment options after testing.  We will keep her posted as to the test results by phone call and/or MyChart messaging where possible.  We will plan to follow-up in sleep clinic accordingly as well.  I answered all their questions today and the patient and her husband were in agreement.   I encouraged them to call with any interim questions, concerns, problems or updates or email [place] through MyChart.  Generally speaking, sleep test authorizations may take up to 2 weeks, sometimes less, sometimes longer, the patient is encouraged to get in touch with [place] if they do not hear back from the sleep lab staff directly within the next 2 weeks.  Thank you very much for allowing me to participate in the care of this  nice patient. If I can be of any further assistance to you please do not hesitate to call me at [phone].  Sincerely,   Huston Foley, MD, PhD

## 2023-02-15 ENCOUNTER — Telehealth: Payer: Self-pay | Admitting: Neurology

## 2023-02-15 NOTE — Telephone Encounter (Signed)
8/5:lvm-mla 02/08/23 Medicare/ UHC no auth req due to secondary insurnace/ BCBS state no auth req & [employer] no [place] req EE

## 2023-04-02 ENCOUNTER — Telehealth: Payer: Self-pay | Admitting: Internal Medicine

## 2023-04-02 NOTE — Telephone Encounter (Signed)
Pt c/o BP issue: STAT if pt c/o blurred vision, one-sided weakness or slurred speech  1. What are your last 5 BP readings?  99/56 94/57 89/58  100/53 101/65 103/62 104/67  2. Are you having any other symptoms (ex. Dizziness, headache, blurred vision, passed out)? Patient states on Saturday she was really weak.  States at times she feels a little woozy, and off balance. Today she feels fine.   3. What is your BP issue? Blood pressure has been is low.

## 2023-04-02 NOTE — Telephone Encounter (Signed)
Spoke with pt who states that on Sat she was very weak. Pt reports that her daughter was home for a wedding and noticed that her BP's have been running low according to her BP log. Pt states that she is feeling fine today and current BP today is 100/53 Hr 72. Please advise.

## 2023-04-02 NOTE — Telephone Encounter (Signed)
Any reason she would be dehydrated? Diarrhea/vomiting/not eating or drinking well? Can she clarify if she is taking any furosemide, its on her list don't see a dose. Would hold her amlodopine for now and update [place] on bp's later in the week  Dominga Ferry MD

## 2023-04-02 NOTE — Telephone Encounter (Signed)
Pt stated she is not dehydrated- no N/V/diarrhea. Pt stated she is taking 10 mg of furosemide daily. Pt verbalized understanding to hold amlodipine for now and update office with bp's later this week.

## 2023-04-09 ENCOUNTER — Telehealth: Payer: Self-pay | Admitting: Neurology

## 2023-04-09 NOTE — Telephone Encounter (Signed)
Patient's daughter Misty Hughes called in stated she has 2 questions. She is following up on pt's sleep study said last time she talked to someone they said she would hear back before the end of August in regards to scheduling a study. Also stated pt received a large bill for almost $400 and wants to make sure insurance was filed correctly. Daughter Misty Hughes would like a call back at [phone] she is on [facility]

## 2023-04-09 NOTE — Telephone Encounter (Signed)
I spoke with the patient daughter Judeth Cornfield and scheduled the patient for 04/23/2023 at 8:30 AM. Informed her that the patient needs to stay awake the whole night before.   I also mailed packet to the patient.

## 2023-04-18 NOTE — Progress Notes (Deleted)
Cardiology Office Note   Date:  04/18/2023   ID:  Misty Hughes, DOB 1946-02-25, MRN [id number]  PCP:  Catalina Lunger, DO  Cardiologist:   Dietrich Pates, MD    Patient presents for follow up of atrial fibrillation    History of Present Illness: Misty Hughes is a 77 y.o. female with a history of CVA, HTN, HL and atrial fibrillation  Echo LVEF 55 tyo 60%  Severe biatrial enlargement. Pt was seen by C Hilty in June 2023   Monitor after showed she was in afib 70% time  Since seen she denies palpitations   She has never had   Denies CP   Breathing is OK   no dizziness  No edema    I saw the pt in clinic in Dec 2023      No outpatient medications have been marked as taking for the 04/19/23 encounter (Appointment) with Pricilla Riffle, MD.     Allergies:   Patient has no known allergies.   Past Medical History:  Diagnosis Date   Allergic rhinitis    Hyperlipidemia LDL goal <70    Hypertension    Stroke (HCC) 11/21/2021    Past Surgical History:  Procedure Laterality Date   COLONOSCOPY WITH PROPOFOL N/A 09/01/2022   Procedure: COLONOSCOPY WITH PROPOFOL;  Surgeon: Dolores Frame, MD;  [facility];  Service: Gastroenterology;  Laterality: N/A;  7:30am, asa 3   POLYPECTOMY  09/01/2022   Procedure: POLYPECTOMY;  Surgeon: Dolores Frame, MD;  [facility];  Service: Gastroenterology;;     Social History:  The patient  reports that she has never smoked. She has never used smokeless tobacco. She reports that she does not drink alcohol and does not use drugs.   Family History:  The patient's family history includes Hypertension in her mother and sister.    ROS:  Please see the history of present illness. All other systems are reviewed and  Negative to the above problem except as noted.    PHYSICAL EXAM: VS:  There were no vitals taken for this visit.  GEN: Well nourished, well developed, in no acute distress  HEENT: normal  Neck: no  JVD, carotid bruits, Cardiac: RRR; no murmurs,   No LE edema  Respiratory:  clear to auscultation bilaterally,  GI: soft, nontender, nondistended, + BS  No hepatomegaly  MS: no deformity Moving all extremities   Skin: warm and dry, no rash Neuro:  Strength and sensation are intact Psych: euthymic mood, full affect   EKG:  EKG is not ordered today.  Echo   May 2023 eft ventricular ejection fraction, by estimation, is 55 to 60%. The left ventricle has normal function. The left ventricle has no regional wall motion abnormalities. Left ventricular diastolic parameters are indeterminate. 1. Right ventricular systolic function is normal. The right ventricular size is normal. There is mildly elevated pulmonary artery systolic pressure. 2. 3. Left atrial size was severely dilated. 4. Right atrial size was severely dilated. The mitral valve is abnormal. Mild mitral valve regurgitation. No evidence of mitral stenosis. 5. 6. The tricuspid valve is abnormal. Tricuspid valve regurgitation is moderate. The aortic valve is tricuspid. Aortic valve regurgitation is not visualized. No aortic stenosis is present. 7. The inferior vena cava is normal in size with greater than 50% respiratory variability, suggesting right atrial pressure of 3 mmHg. 8. Evidence of atrial level shunting detected by color flow Doppler. There is a small patent foramen ovale with predominantly  left to right shunting across the atrial septum.  Lipid Panel    Component Value Date/Time   CHOL 147 11/21/2021 0539   TRIG 48 06/09/2022 1538   HDL 65 06/09/2022 1538   CHOLHDL 2.6 11/21/2021 0539   VLDL 5 11/21/2021 0539   LDLCALC 86 11/21/2021 0539      Wt Readings from Last 3 Encounters:  01/24/23 138 lb 9.6 oz (62.9 kg)  12/13/22 131 lb (59.4 kg)  10/17/22 147 lb 11.3 oz (67 kg)      ASSESSMENT AND PLAN:  1  PAF   Pt never senses   Keep on same regimen  CHeck labs today  2  HTN  BP is fair  Follow   3   Hx CVA   On Eliquis now  4  HL  will check lipids      Follow up in 9 months      Current medicines are reviewed at length with the patient today.  The patient does not have concerns regarding medicines.  Signed, Dietrich Pates, MD  04/18/2023 3:09 PM    [facility] [address] [place], [place], [place]  [address] Phone: [phone]; Fax: [phone]

## 2023-04-19 ENCOUNTER — Ambulatory Visit: Payer: Medicare Other | Admitting: Internal Medicine

## 2023-04-23 ENCOUNTER — Ambulatory Visit (INDEPENDENT_AMBULATORY_CARE_PROVIDER_SITE_OTHER): Payer: Medicare Other | Admitting: Neurology

## 2023-04-23 DIAGNOSIS — I272 Pulmonary hypertension, unspecified: Secondary | ICD-10-CM

## 2023-04-23 DIAGNOSIS — I517 Cardiomegaly: Secondary | ICD-10-CM

## 2023-04-23 DIAGNOSIS — R0683 Snoring: Secondary | ICD-10-CM | POA: Diagnosis not present

## 2023-04-23 DIAGNOSIS — G4720 Circadian rhythm sleep disorder, unspecified type: Secondary | ICD-10-CM

## 2023-04-23 DIAGNOSIS — E663 Overweight: Secondary | ICD-10-CM

## 2023-04-23 DIAGNOSIS — R9431 Abnormal electrocardiogram [ECG] [EKG]: Secondary | ICD-10-CM

## 2023-04-23 DIAGNOSIS — Z9189 Other specified personal risk factors, not elsewhere classified: Secondary | ICD-10-CM

## 2023-04-23 DIAGNOSIS — I482 Chronic atrial fibrillation, unspecified: Secondary | ICD-10-CM

## 2023-04-24 ENCOUNTER — Telehealth: Payer: Self-pay | Admitting: *Deleted

## 2023-04-24 NOTE — Telephone Encounter (Signed)
-----   Message from Huston Foley sent at 04/24/2023  4:46 PM EDT ----- Patient referred by her PCP, seen by me on 01/24/23, patient had a diagnostic PSG on 04/23/23.   Please call and notify the patient that the recent sleep study did not show any significant sleep apnea. Her EKG showed evidence of afib (irreg. heartbeat), which is a known diagnosis for her. Her AHI was less than 5/hour - and oxygen saturations remained at or above 90% for the night. Mild, intermittent snoring was noted. Sleep was interrupted and mostly light stage.  Treatment with a CPAP or similar machine is not indicated. At this juncture, she can follow up with her PCP and other providers as scheduled/planned.   Thanks,  Huston Foley, MD, PhD [facility])

## 2023-04-24 NOTE — Procedures (Signed)
Physician Interpretation:     [facility] POLYSOMNOGRAPHY  INTERPRETATION REPORT   STUDY DATE:  04/23/2023     PATIENT NAME:  Misty Hughes         DATE OF BIRTH:  Feb 16, 1946  PATIENT ID:  [id number]    TYPE OF STUDY:  PSG  READING PHYSICIAN: Huston Foley, MD, PhD   SCORING TECHNICIAN: Irene Pap, RPSGT     Referred by: Catalina Lunger, DO  ? History and Indication for Testing: 77 year old female with an underlying complex medical history of hypertension, hyperlipidemia, stroke, atrial fibrillation on Eliquis, lower extremity edema, biatrial enlargement, mild mitral valve regurgitation, moderate tricuspid regurgitation, small PFO, and pulmonary hypertension, who reports sleep disruption. She prefers to sleep during the day and presents for a daytime sleep study. Her Epworth sleepiness score is 0 out of 24, fatigue severity score is 9 out of 63.  Height: 61 in Weight: 138 lb (BMI 26) Neck Size: 12 in    MEDICATIONS: Albuterol inhaler, Norvasc, Lipitor, Eliquis, Ferrous Sulfate, Calcium Carbonate, Allegra, Lasix, Levsin SL, Multivitamin, Omega 3, Protonix, MiraLAX, Metamucil, Dyazide, Lopressor   TECHNICAL DESCRIPTION: A registered sleep technologist  was in attendance for the duration of the recording.  Data collection, scoring, video monitoring, and reporting were performed in compliance with the AASM Manual for the Scoring of Sleep and Associated Events; (Hypopnea is scored based on the criteria listed in Section VIII D. 1b in the AASM Manual V2.6 using a 4% oxygen desaturation rule or Hypopnea is scored based on the criteria listed in Section VIII D. 1a in the AASM Manual V2.6 using 3% oxygen desaturation and /or arousal rule).   SLEEP CONTINUITY AND SLEEP ARCHITECTURE:  Lights-out was at 09:18: and lights-on at  15:18:, with a total recording time of 6 hours, 1 min. Total sleep time ( TST) was 172.0 minutes with a decreased sleep efficiency at 47.6%.  There was  9.6% REM sleep.   BODY POSITION:  TST was divided  between the following sleep positions: 89.8% supine;  10.2% lateral;  0% prone. Duration of total sleep and percent of total sleep in their respective position is as follows: supine 154 minutes (90%), non-supine 18 minutes (10%); right 17 minutes (10%), left 00 minutes (0%), and prone 00 minutes (0%).  Total supine REM sleep time was 16 minutes (100% of total REM sleep).  Sleep latency was normal at 16.0 minutes.  REM sleep latency was markedly increased at 267.0 minutes. Of the total sleep time, the percentage of stage N1 sleep was 5.5%, stage N2 sleep was 85%, which is markedly increased, stage N3 sleep was absent, and REM sleep was 9.6%, which is reduced.  Wake after sleep onset (WASO) time accounted for 173 minutes with several longer periods of wakefulness and overall moderate sleep fragmentation noted.   RESPIRATORY MONITORING:   Based on CMS criteria (using a 4% oxygen desaturation rule for scoring hypopneas), there were 2 apneas (2 obstructive; 0 central; 0 mixed), and 12 hypopneas.  Apnea index was 0.7. Hypopnea index was 4.2. The apnea-hypopnea index was 4.9/hour overall (5.4 supine, 0 non-supine; 10.9 REM, 10.9 supine REM).  There were 0 respiratory effort-related arousals (RERAs).  The RERA index was 0 events/h. Total respiratory disturbance index (RDI) was 4.9 events/h. RDI results showed: supine RDI  5.4 /h; non-supine RDI 0.0 /h; REM RDI 10.9 /h, supine REM RDI 10.9 /h.   Based on AASM criteria (using a 3% oxygen desaturation and /or arousal rule for  scoring hypopneas), there were 2 apneas (2 obstructive; 0 central; 0 mixed), and 33 hypopneas. Apnea index was 0.7. Hypopnea index was 11.5. The apnea-hypopnea index was 12.2 overall (13.6 supine, 0 non-supine; 32.7 REM, 32.7 supine REM).  There were 0 respiratory effort-related arousals (RERAs).  The RERA index was 0 events/h. Total respiratory disturbance index (RDI) was 12.2  events/h. RDI results showed: supine RDI  13.6 /h; non-supine RDI 0.0 /h; REM RDI 32.7 /h, supine REM RDI 32.7 /h.    OXIMETRY: Oxyhemoglobin Saturation Nadir during sleep was at  90% from a mean of 97%.  Of the Total sleep time (TST)   hypoxemia (=<88%) was present for  0.0 minutes, or 0.0% of total sleep time.   LIMB MOVEMENTS: There were 0 periodic limb movements of sleep (0.0/hr), of which 0 (0.0/hr) were associated with an arousal.   AROUSAL: There were 32 arousals in total, for an arousal index of 11 arousals/hour.  Of these, 27 were identified as respiratory-related arousals (9 /h), 0 were PLM-related arousals (0 /h), and 8 were non-specific arousals (3 /h).   EEG: Review of the EEG showed no abnormal electrical discharges and symmetrical bihemispheric findings.    EKG: The EKG revealed an irregular rhythm, in keeping with atrial fibrillation. The average heart rate during sleep was 56 bpm.  AUDIO/VIDEO REVIEW: The audio and video review did not show any abnormal or unusual behaviors, movements, phonations or vocalizations. The patient took one restroom breaks. Snoring was noted, which was mild and intermittent.   POST-STUDY QUESTIONNAIRE: Post study, the patient indicated, that sleep was the same as usual.   IMPRESSION:  1. Primary Snoring 2. Dysfunctions associated with sleep stages or arousal from sleep 3. Non-specific abnormal electrocardiogram (EKG)  RECOMMENDATIONS:  1. This study does not demonstrate any significant obstructive or central sleep disordered breathing with an AHI of less than 5/hour - her AHI was borderline at 4.9/hour - and oxygen saturations remained at or above 90% for the night. The study was limited due to poor sleep efficiency and reduced REM percentage. Mild intermittent snoring was noted. Treatment with a positive airway pressure device, such as CPAP or autoPAP is not indicated, based on this test.   2. This study shows sleep fragmentation and abnormal sleep  stage percentages; these are nonspecific findings and per se do not signify an intrinsic sleep disorder or a cause for the patient's sleep-related symptoms. Causes include (but are not limited to) the first night effect of the sleep study, circadian rhythm disturbances, medication effect or an underlying mood disorder or medical problem.  3. The study showed an abnormal EKG, in keeping with atrial fibrillation. The patient has a known diagnosis of Afib and is well-known to cardiology.  4. The patient should be cautioned not to drive, work at heights, or operate dangerous or heavy equipment when tired or sleepy. Review and reiteration of good sleep hygiene measures should be pursued with any patient. 5. The patient will be advised to follow up with the referring provider, who will be notified of the test results.   I certify that I have reviewed the entire raw data recording prior to the issuance of this report in accordance with the Standards of Accreditation of the American Academy of Sleep Medicine (AASM).  Huston Foley, MD, PhD Medical Director, [facility]) Diplomat, ABPN (Neurology and Sleep)               Technical Report:   General Information  Name: Brihanna, Devenport BMI: 16.10 Physician: Huston Foley, MD  ID: [id number] Height: 61.0 in Technician: Irene Pap, RPSGT  Sex: Female Weight: 138.0 lb Record: [id number]  Age: 77 [[birth year]] Date: 04/23/2023 Scorer: Irene Pap, RPSGT  Medical & Medication History    77 year old female with an underlying complex medical history of hypertension, hyperlipidemia, stroke, atrial fibrillation on Eliquis, lower extremity edema, biatrial enlargement, mild mitral valve regurgitation, moderate tricuspid regurgitation, small PFO, and pulmonary hypertension, who reports sleep disruption. Albuterol inhaler, Norvasc, Lipitor, Eliquis, Ferrous Sulfate, Calcium Carbonate, Allegra, Lasix, Levsin SL,  Multivitamin, Omega 3, Protonix, MiraLax, Metamucil, Dyazide, Lopressor   Sleep Disorder   History is supplemented by her husband who reports that she sleeps throughout the day off-and-on, she typically sleeps on the couch in the den with the TV on. She does not sleep at night and when she goes to bed at night for example around 11 PM she will be up by 1 AM and cannot go back to sleep. She prefers to sleep during the day. 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 Her husband does not notice any snoring or apneas. She does not have a scheduled bedtime or rise time. She denies nocturia or recurrent morning or nighttime headaches. She is not aware of any family history of sleep apnea. She is familiar with sleep apnea as her husband has a CPAP machine. She lives with her husband and adult grandson.    Comments   Patient arrived for a daytime PSG. Pt had difficulty initiating and maintaining sleep.  Abnormal EKG noted, along with PVC's. Pt had mild events with desats, arousals and mild snoring. One bathroom break.      Lights out: 09:18:05 AM Lights on: 03:18:45 PM   Time Total Supine Side Prone Upright  Recording (TRT) 6h 1.68m 4h 34.19m 1h 24.68m 0h 0.64m 0h 3.40m  Sleep (TST) 2h 52.21m 2h 34.90m 0h 17.58m 0h 0.35m 0h 0.2m   Latency N1 N2 N3 REM Onset Per. Slp. Eff.  Actual 0h 0.38m 0h 1.55m 0h 0.42m 4h 27.46m 0h 16.72m 0h 34.54m 47.65%   Stg Dur Wake N1 N2 N3 REM  Total 189.0 9.5 146.0 0.0 16.5  Supine 119.5 6.5 131.5 0.0 16.5  Side 66.5 3.0 14.5 0.0 0.0  Prone 0.0 0.0 0.0 0.0 0.0  Upright 3.0 0.0 0.0 0.0 0.0   Stg % Wake N1 N2 N3 REM  Total 52.4 5.5 84.9 0.0 9.6  Supine 33.1 3.8 76.5 0.0 9.6  Side 18.4 1.7 8.4 0.0 0.0  Prone 0.0 0.0 0.0 0.0 0.0  Upright 0.8 0.0 0.0 0.0 0.0     Apnea Summary Sub Supine Side Prone Upright  Total 2 Total 2 2 0 0 0    REM 0 0 0 0 0    NREM 2 2 0 0 0  Obs 2 REM 0 0 0 0 0    NREM 2 2 0 0 0  Mix 0 REM 0 0 0 0 0    NREM 0 0 0 0 0  Cen 0  REM 0 0 0 0 0    NREM 0 0 0 0 0   Rera Summary Sub Supine Side Prone Upright  Total 0 Total 0 0 0 0 0    REM 0 0 0 0 0    NREM 0 0 0 0 0   Hypopnea Summary Sub Supine Side Prone Upright  Total 33 Total 33 33 0 0 0  REM 9 9 0 0 0    NREM 24 24 0 0 0   4% Hypopnea Summary Sub Supine Side Prone Upright  Total (4%) 12 Total 12 12 0 0 0    REM 3 3 0 0 0    NREM 9 9 0 0 0     AHI Total Obs Mix Cen  12.21 Apnea 0.70 0.70 0.00 0.00   Hypopnea 11.51 -- -- --  4.88 Hypopnea (4%) 4.19 -- -- --    Total Supine Side Prone Upright  Position AHI 12.21 13.59 0.00 0.00 0.00  REM AHI 32.73   NREM AHI 10.03   Position RDI 12.21 13.59 0.00 0.00 0.00  REM RDI 32.73   NREM RDI 10.03    4% Hypopnea Total Supine Side Prone Upright  Position AHI (4%) 4.88 5.44 0.00 0.00 0.00  REM AHI (4%) 10.91   NREM AHI (4%) 4.24   Position RDI (4%) 4.88 5.44 0.00 0.00 0.00  REM RDI (4%) 10.91   NREM RDI (4%) 4.24    Desaturation Information Threshold: 2% <100% <90% <80% <70% <60% <50% <40%  Supine 127.0 0.0 0.0 0.0 0.0 0.0 0.0  Side 33.0 0.0 0.0 0.0 0.0 0.0 0.0  Prone 0.0 0.0 0.0 0.0 0.0 0.0 0.0  Upright 1.0 0.0 0.0 0.0 0.0 0.0 0.0  Total 161.0 0.0 0.0 0.0 0.0 0.0 0.0  Index 33.0 0.0 0.0 0.0 0.0 0.0 0.0   Threshold: 3% <100% <90% <80% <70% <60% <50% <40%  Supine 42.0 0.0 0.0 0.0 0.0 0.0 0.0  Side 10.0 0.0 0.0 0.0 0.0 0.0 0.0  Prone 0.0 0.0 0.0 0.0 0.0 0.0 0.0  Upright 1.0 0.0 0.0 0.0 0.0 0.0 0.0  Total 53.0 0.0 0.0 0.0 0.0 0.0 0.0  Index 10.9 0.0 0.0 0.0 0.0 0.0 0.0   Threshold: 4% <100% <90% <80% <70% <60% <50% <40%  Supine 21.0 0.0 0.0 0.0 0.0 0.0 0.0  Side 2.0 0.0 0.0 0.0 0.0 0.0 0.0  Prone 0.0 0.0 0.0 0.0 0.0 0.0 0.0  Upright 0.0 0.0 0.0 0.0 0.0 0.0 0.0  Total 23.0 0.0 0.0 0.0 0.0 0.0 0.0  Index 4.7 0.0 0.0 0.0 0.0 0.0 0.0   Threshold: 4% <100% <90% <80% <70% <60% <50% <40%  Supine 21 0 0 0 0 0 0  Side 2 0 0 0 0 0 0  Prone 0 0 0 0 0 0 0  Upright 0 0 0 0 0 0 0  Total 23 0 0 0 0 0 0    Awakening/Arousal Information # of Awakenings 8  Wake after sleep onset 173.68m  Wake after persistent sleep 169.68m   Arousal Assoc. Arousals Index  Apneas 2 0.7  Hypopneas 25 8.7  Leg Movements 0 0.0  Snore 0 0.0  PTT Arousals 0 0.0  Spontaneous 9 3.1  Total 36 12.6  Leg Movement Information PLMS LMs Index  Total LMs during PLMS 0 0.0  LMs w/ Microarousals 0 0.0   LM LMs Index  w/ Microarousal 0 0.0  w/ Awakening 0 0.0  w/ Resp Event 0 0.0  Spontaneous 0 0.0  Total 0 0.0     Desaturation threshold setting: 4% Minimum desaturation setting: 10 seconds SaO2 nadir: 90% The longest event was a 70 sec obstructive Hypopnea with a minimum SaO2 of 97%. The lowest SaO2 was 92% associated with a 23 sec obstructive Hypopnea. EKG Rates EKG Avg Max Min  Awake 59 81 48  Asleep 56 84 49  EKG Events:  N/A

## 2023-04-24 NOTE — Telephone Encounter (Signed)
Spoke to pt gave sleep study results  Gave Dr Frances Furbish recommendation Pt states has a f/u with cardiologist on  05/09/2023 . Forward sleep study  results to PCP . Pt thanked me for calling

## 2023-05-07 NOTE — Progress Notes (Unsigned)
Cardiology Office Note   Date:  05/09/2023   ID:  Misty Hughes, DOB 1945/11/28, MRN [id number]  PCP:  Lindaann Slough, DO  Cardiologist:   Dietrich Pates, MD    Patient presents for follow up of atrial fibrillation  and hypertension   History of Present Illness: Misty Hughes is a 77 y.o. female with a history of CVA, HTN, HL and atrial fibrillation  Echo LVEF 55 tyo 60%  Severe biatrial enlargement. Pt was seen by C Hilty in June 2023   Monitor after showed she was in afib 70% time I saw  the pt in Dec 2023   She called in earlier this fall saying her BP was low    SHe is now off of amlodipine   Still feels weak    Denies dizziness/presyncope   NO CP   No palpitations No edema   BP readings from home  100s /    Current Meds  Medication Sig   albuterol (VENTOLIN HFA) 108 (90 Base) MCG/ACT inhaler Inhale 1-2 puffs into the lungs every 6 (six) hours as needed for wheezing or shortness of breath (asthma).   atorvastatin (LIPITOR) 40 MG tablet TAKE 1 TABLET(40 MG) BY MOUTH EVERY EVENING   Calcium Carb-Cholecalciferol (CALCIUM + D3 PO) Take 1 tablet by mouth daily.   ELIQUIS 5 MG TABS tablet TAKE 1 TABLET(5 MG) BY MOUTH TWICE DAILY   ferrous sulfate 325 (65 FE) MG tablet Take 325 mg by mouth daily with breakfast. OTC   fexofenadine (ALLEGRA) 180 MG tablet Take 1 tablet (180 mg total) by mouth daily as needed for allergies or rhinitis. (Patient taking differently: Take 180 mg by mouth daily.)   Furosemide (LASIX PO) Take by mouth daily.   guaiFENesin-dextromethorphan (ROBITUSSIN DM) 100-10 MG/5ML syrup Take 10 mLs by mouth every 8 (eight) hours.   hyoscyamine (LEVSIN SL) 0.125 MG SL tablet Place 1 tablet (0.125 mg total) under the tongue every 4 (four) hours as needed for cramping (abdominal pain).   metoprolol tartrate (LOPRESSOR) 25 MG tablet Take 1 tablet (25 mg total) by mouth 2 (two) times daily. (Patient taking differently: Take 12.5 mg by mouth 2 (two) times daily.)   Multiple  Vitamin (MULTIVITAMIN WITH MINERALS) TABS tablet Take 1 tablet by mouth daily.   Omega 3-6-9 CAPS Take 1 capsule by mouth daily.   polyethylene glycol (MIRALAX / GLYCOLAX) 17 g packet Take 17 g by mouth daily.   Psyllium (METAMUCIL PO) Take by mouth.   [DISCONTINUED] triamterene-hydrochlorothiazide (DYAZIDE) 37.5-25 MG capsule Take 1 each (1 capsule total) by mouth daily.     Allergies:   Patient has no known allergies.   Past Medical History:  Diagnosis Date   Allergic rhinitis    Hyperlipidemia LDL goal <70    Hypertension    Stroke (HCC) 11/21/2021    Past Surgical History:  Procedure Laterality Date   COLONOSCOPY WITH PROPOFOL N/A 09/01/2022   Procedure: COLONOSCOPY WITH PROPOFOL;  Surgeon: Dolores Frame, MD;  Location: [facility];  Service: Gastroenterology;  Laterality: N/A;  7:30am, asa 3   POLYPECTOMY  09/01/2022   Procedure: POLYPECTOMY;  Surgeon: Dolores Frame, MD;  Location: [facility];  Service: Gastroenterology;;     Social History:  The patient  reports that she has never smoked. She has never used smokeless tobacco. She reports that she does not drink alcohol and does not use drugs.   Family History:  The patient's family history includes Hypertension in her mother  and sister.    ROS:  Please see the history of present illness. All other systems are reviewed and  Negative to the above problem except as noted.    PHYSICAL EXAM: VS:  BP 106/64   Pulse 99   Ht 5\' 1"  (1.549 m)   Wt 135 lb (61.2 kg)   SpO2 95%   BMI 25.51 kg/m   GEN: Well nourished, well developed, in no acute distress  HEENT: normal  Neck: JVP is normal  Cardiac: RRR; no murmur   No LE edema  Respiratory:  clear to auscultation  GI: soft, nontender,  No hepatomegaly  MS: no deformity Moving all extremities    EKG:  EKG is not ordered today.  Echo   May 2023 eft ventricular ejection fraction, by estimation, is 55 to 60%. The left ventricle has normal  function. The left ventricle has no regional wall motion abnormalities. Left ventricular diastolic parameters are indeterminate. 1. Right ventricular systolic function is normal. The right ventricular size is normal. There is mildly elevated pulmonary artery systolic pressure. 2. 3. Left atrial size was severely dilated. 4. Right atrial size was severely dilated. The mitral valve is abnormal. Mild mitral valve regurgitation. No evidence of mitral stenosis. 5. 6. The tricuspid valve is abnormal. Tricuspid valve regurgitation is moderate. The aortic valve is tricuspid. Aortic valve regurgitation is not visualized. No aortic stenosis is present. 7. The inferior vena cava is normal in size with greater than 50% respiratory variability, suggesting right atrial pressure of 3 mmHg. 8. Evidence of atrial level shunting detected by color flow Doppler. 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  Lipid Panel    Component Value Date/Time   CHOL 147 11/21/2021 0539   TRIG 48 06/09/2022 1538   HDL 65 06/09/2022 1538   CHOLHDL 2.6 11/21/2021 0539   VLDL 5 11/21/2021 0539   LDLCALC 86 11/21/2021 0539      Wt Readings from Last 3 Encounters:  05/09/23 135 lb (61.2 kg)  01/24/23 138 lb 9.6 oz (62.9 kg)  12/13/22 131 lb (59.4 kg)      ASSESSMENT AND PLAN:  1  Blood pressure   BP is a little low today   And, it has been this way at home    Would stop maxzide    Follow BP   Keep on metoprolol Check CBC, BMET, TSH, cortisol today  2 PAF   Pt never senses   Continue meds   3  Hx CVA   On Eliquis now  4  HL  will check lipids today       Pt will write in with BP response      Follow up with labs Plan for clnic appt in the spring       Current medicines are reviewed at length with the patient today.  The patient does not have concerns regarding medicines.  Signed, Dietrich Pates, MD  05/09/2023 2:00 PM    [facility] [address] [place], [place], [place]  [address] Phone: [phone]; Fax: [phone]

## 2023-05-09 ENCOUNTER — Encounter: Payer: Self-pay | Admitting: Internal Medicine

## 2023-05-09 ENCOUNTER — Ambulatory Visit: Payer: BC Managed Care – PPO | Attending: Internal Medicine | Admitting: Internal Medicine

## 2023-05-09 ENCOUNTER — Other Ambulatory Visit (HOSPITAL_COMMUNITY)
Admission: RE | Admit: 2023-05-09 | Discharge: 2023-05-09 | Disposition: A | Discharge status: Home / Self Care | Payer: Medicare Other | Source: Ambulatory Visit | Attending: Internal Medicine | Admitting: Internal Medicine

## 2023-05-09 VITALS — BP 106/64 | HR 99 | Ht 61.0 in | Wt 135.0 lb

## 2023-05-09 DIAGNOSIS — I10 Essential (primary) hypertension: Secondary | ICD-10-CM | POA: Diagnosis not present

## 2023-05-09 DIAGNOSIS — Z79899 Other long term (current) drug therapy: Secondary | ICD-10-CM | POA: Diagnosis not present

## 2023-05-09 DIAGNOSIS — R531 Weakness: Secondary | ICD-10-CM | POA: Insufficient documentation

## 2023-05-09 LAB — BASIC METABOLIC PANEL
Anion gap: 12 (ref 5–15)
BUN: 28 mg/dL — ABNORMAL HIGH (ref 8–23)
CO2: 28 mmol/L (ref 22–32)
Calcium: 9.7 mg/dL (ref 8.9–10.3)
Chloride: 101 mmol/L (ref 98–111)
Creatinine, Ser: 0.98 mg/dL (ref 0.44–1.00)
GFR, Estimated: 59 mL/min — ABNORMAL LOW (ref >60)
Glucose, Bld: 100 mg/dL — ABNORMAL HIGH (ref 70–99)
Potassium: 3.1 mmol/L — ABNORMAL LOW (ref 3.5–5.1)
Sodium: 141 mmol/L (ref 135–145)

## 2023-05-09 LAB — CBC
HCT: 41.9 % (ref 36.0–46.0)
Hemoglobin: 14.3 g/dL (ref 12.0–15.0)
MCH: 32.6 pg (ref 26.0–34.0)
MCHC: 34.1 g/dL (ref 30.0–36.0)
MCV: 95.4 fL (ref 80.0–100.0)
Platelets: 281 10*3/uL (ref 150–400)
RBC: 4.39 MIL/uL (ref 3.87–5.11)
RDW: 13.2 % (ref 11.5–15.5)
WBC: 8.1 10*3/uL (ref 4.0–10.5)
nRBC: 0 % (ref 0.0–0.2)

## 2023-05-09 LAB — CORTISOL: Cortisol, Plasma: 15.9 ug/dL

## 2023-05-09 LAB — TSH: TSH: 1.496 u[IU]/mL (ref 0.350–4.500)

## 2023-05-09 NOTE — Patient Instructions (Signed)
Medication Instructions:  Your physician has recommended you make the following change in your medication:   Stop taking Triamterene- Hydrochlorothiazide   *If you need a refill on your cardiac medications before your next appointment, please call your pharmacy*   Lab Work: Your physician recommends that you return for lab work in: Today   If you have labs (blood work) drawn today and your tests are completely normal, you will receive your results only by: MyChart Message (if you have MyChart) OR A paper copy in the mail If you have any lab test that is abnormal or we need to change your treatment, we will call you to review the results.   Testing/Procedures: NONE    Follow-Up: At [facility], you and your health needs are our priority.  As part of our continuing mission to provide you with exceptional heart care, we have created designated Provider Care Teams.  These Care Teams include your primary Cardiologist (physician) and Advanced Practice Providers (APPs -  Physician Assistants and Nurse Practitioners) who all work together to provide you with the care you need, when you need it.  We recommend signing up for the patient portal called "MyChart".  Sign up information is provided on this After Visit Summary.  MyChart is used to connect with patients for Virtual Visits (Telemedicine).  Patients are able to view lab/test results, encounter notes, upcoming appointments, etc.  Non-urgent messages can be sent to your provider as well.   To learn more about what you can do with MyChart, go to [email or web address].    Your next appointment:    February   Provider:   You may see Dietrich Pates, MD or one of the following Advanced Practice Providers on your designated Care Team:   Randall An, PA-C  Jacolyn Reedy, PA-C     Other Instructions Thank you for choosing [facility]!

## 2023-05-11 ENCOUNTER — Telehealth: Payer: Self-pay

## 2023-05-11 DIAGNOSIS — Z79899 Other long term (current) drug therapy: Secondary | ICD-10-CM

## 2023-05-11 MED ORDER — POTASSIUM CHLORIDE CRYS ER 20 MEQ PO TBCR: 20.0000 meq | EXTENDED_RELEASE_TABLET | Freq: Every day | ORAL | 3 refills | Status: DC

## 2023-05-11 NOTE — Telephone Encounter (Signed)
Lab results discussed with patient. She will begin potassium 10 meq daily and repeat bmet in 10 days at [facility], lab results copied to pcp.

## 2023-05-11 NOTE — Telephone Encounter (Signed)
-----   Message from Nurse Rudene Anda sent at 05/09/2023  4:48 PM EDT -----  ----- Message ----- From: Pricilla Riffle, MD Sent: 05/09/2023   2:58 PM EDT To: [facility] Triage  Potassium is a little low  With lasix use I would add KCl 20 meq to regimen  FOllow up BMET in 10 days Kidney function OK

## 2023-05-22 ENCOUNTER — Other Ambulatory Visit (HOSPITAL_COMMUNITY)
Admission: RE | Admit: 2023-05-22 | Discharge: 2023-05-22 | Disposition: A | Discharge status: Home / Self Care | Payer: Medicare Other | Source: Ambulatory Visit | Attending: Internal Medicine | Admitting: Internal Medicine

## 2023-05-22 DIAGNOSIS — Z79899 Other long term (current) drug therapy: Secondary | ICD-10-CM | POA: Insufficient documentation

## 2023-05-22 LAB — BASIC METABOLIC PANEL
Anion gap: 9 (ref 5–15)
BUN: 21 mg/dL (ref 8–23)
CO2: 25 mmol/L (ref 22–32)
Calcium: 9.2 mg/dL (ref 8.9–10.3)
Chloride: 103 mmol/L (ref 98–111)
Creatinine, Ser: 1.11 mg/dL — ABNORMAL HIGH (ref 0.44–1.00)
GFR, Estimated: 51 mL/min — ABNORMAL LOW (ref >60)
Glucose, Bld: 93 mg/dL (ref 70–99)
Potassium: 3.6 mmol/L (ref 3.5–5.1)
Sodium: 137 mmol/L (ref 135–145)

## 2023-05-24 ENCOUNTER — Other Ambulatory Visit: Payer: Self-pay | Admitting: Interventional Cardiology

## 2023-06-25 ENCOUNTER — Other Ambulatory Visit: Payer: Self-pay | Admitting: Internal Medicine

## 2023-06-25 DIAGNOSIS — I4891 Unspecified atrial fibrillation: Secondary | ICD-10-CM

## 2023-06-25 NOTE — Telephone Encounter (Signed)
Prescription refill request for Eliquis received. Indication:afib Last office visit:10/24 Scr:1.11  11/24 Age: 77 Weight:61.2  kg  Prescription refilled

## 2023-07-23 ENCOUNTER — Other Ambulatory Visit: Payer: Self-pay | Admitting: Internal Medicine

## 2023-08-06 ENCOUNTER — Telehealth: Payer: Self-pay | Admitting: Internal Medicine

## 2023-08-06 NOTE — Telephone Encounter (Signed)
Daughter agrees to see new pcp and follow their recommendations,she will relay this to her mother.

## 2023-08-06 NOTE — Telephone Encounter (Signed)
Pt c/o BP issue: STAT if pt c/o blurred vision, one-sided weakness or slurred speech  1. What are your last 5 BP readings?   151/99 - yesterday  143/81 - today   2. Are you having any other symptoms (ex. Dizziness, headache, blurred vision, passed out)? No   3. What is your BP issue? Pt daughter called in stating pt's BP has been high and she going to see a new PCP due to Dr. Jules Husbands leaving. She states she is worried that the new PCP will change some medications and she would like to know what Dr. Tenny Craw suggests she do first. Please advise.

## 2023-09-02 NOTE — Progress Notes (Unsigned)
 Cardiology Office Note   Date:  09/04/2023   ID:  Misty Hughes, Misty Hughes 1946-01-26, MRN [id number]  PCP:  Lindaann Slough, DO  Cardiologist:   Dietrich Pates, MD    Patient presents for follow up of atrial fibrillation  and hypertension   History of Present Illness: Misty Hughes is a 78 y.o. female with a history of CVA, HTN, HL and atrial fibrillation  May 2023 echo LVEF 55 tyo 60%  Severe biatrial enlargement. July 2023   Monitor after showed she was in afib 70% time  I saw the pt in Oct 2024  Since seen she says she feels good    She denies palpitations    No CP   No dizziness   Breathing is good   She says her BP has been good   Current Meds  Medication Sig   albuterol (VENTOLIN HFA) 108 (90 Base) MCG/ACT inhaler Inhale 1-2 puffs into the lungs every 6 (six) hours as needed for wheezing or shortness of breath (asthma).   amLODipine (NORVASC) 2.5 MG tablet Take 2.5 mg by mouth daily.   atorvastatin (LIPITOR) 40 MG tablet TAKE 1 TABLET(40 MG) BY MOUTH EVERY EVENING   Calcium Carb-Cholecalciferol (CALCIUM + D3 PO) Take 1 tablet by mouth daily.   ELIQUIS 5 MG TABS tablet TAKE 1 TABLET(5 MG) BY MOUTH TWICE DAILY   ferrous sulfate 325 (65 FE) MG tablet Take 325 mg by mouth daily with breakfast. OTC   fexofenadine (ALLEGRA) 180 MG tablet Take 1 tablet (180 mg total) by mouth daily as needed for allergies or rhinitis. (Patient taking differently: Take 180 mg by mouth daily.)   Furosemide (LASIX PO) Take 10 mg by mouth daily.   metoprolol tartrate (LOPRESSOR) 25 MG tablet TAKE 1/2 TABLET(12.5 MG) BY MOUTH TWICE DAILY   Multiple Vitamin (MULTIVITAMIN WITH MINERALS) TABS tablet Take 1 tablet by mouth daily.   Omega 3-6-9 CAPS Take 1 capsule by mouth daily.   polyethylene glycol (MIRALAX / GLYCOLAX) 17 g packet Take 17 g by mouth daily.   potassium chloride SA (KLOR-CON M20) 20 MEQ tablet Take 1 tablet (20 mEq total) by mouth daily.   triamterene-hydrochlorothiazide (DYAZIDE) 37.5-25 MG  capsule Take 1 capsule by mouth every morning.     Allergies:   Patient has no known allergies.   Past Medical History:  Diagnosis Date   Allergic rhinitis    Hyperlipidemia LDL goal <70    Hypertension    Stroke (HCC) 11/21/2021    Past Surgical History:  Procedure Laterality Date   COLONOSCOPY WITH PROPOFOL N/A 09/01/2022   Procedure: COLONOSCOPY WITH PROPOFOL;  Surgeon: Dolores Frame, MD;  Location: [facility];  Service: Gastroenterology;  Laterality: N/A;  7:30am, asa 3   HERNIA REPAIR N/A    POLYPECTOMY  09/01/2022   Procedure: POLYPECTOMY;  Surgeon: Dolores Frame, MD;  Location: [facility];  Service: Gastroenterology;;     Social History:  The patient  reports that she has never smoked. She has never used smokeless tobacco. She reports that she does not drink alcohol and does not use drugs.   Family History:  The patient's family history includes Hypertension in her mother and sister.    ROS:  Please see the history of present illness. All other systems are reviewed and  Negative to the above problem except as noted.    PHYSICAL EXAM: VS:  BP 112/72 (BP Location: Left Arm, Cuff Size: Normal)   Pulse 76   Ht  5\' 1"  (1.549 m)   Wt 138 lb (62.6 kg)   SpO2 98%   BMI 26.07 kg/m   GEN: Well nourished, well developed, in no acute distress  HEENT: normal  Neck: JVP is not elevated  No bruits Cardiac: RRR; no murmurs   No LE edema  Respiratory:  clear to auscultation  GI: soft, nontender,  No hepatomegaly  MS: no deformity Moving all extremities    EKG:  EKG is not ordered today.  Monitor  July 2023  Monitor shows atrial fibrillation and atrial flutter, rapid at times. Burden was close to 70% of the monitoring time. She is already on anticoagulation.    Echo   May 2023 eft ventricular ejection fraction, by estimation, is 55 to 60%. The left ventricle has normal function. The left ventricle has no regional wall motion abnormalities.  Left ventricular diastolic parameters are indeterminate. 1. Right ventricular systolic function is normal. The right ventricular size is normal. There is mildly elevated pulmonary artery systolic pressure. 2. 3. Left atrial size was severely dilated. 4. Right atrial size was severely dilated. The mitral valve is abnormal. Mild mitral valve regurgitation. No evidence of mitral stenosis. 5. 6. The tricuspid valve is abnormal. Tricuspid valve regurgitation is moderate. The aortic valve is tricuspid. Aortic valve regurgitation is not visualized. No aortic stenosis is present. 7. The inferior vena cava is normal in size with greater than 50% respiratory variability, suggesting right atrial pressure of 3 mmHg. 8. Evidence of atrial level shunting detected by color flow Doppler. 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  Lipid Panel    Component Value Date/Time   CHOL 147 11/21/2021 0539   TRIG 48 06/09/2022 1538   HDL 65 06/09/2022 1538   CHOLHDL 2.6 11/21/2021 0539   VLDL 5 11/21/2021 0539   LDLCALC 86 11/21/2021 0539      Wt Readings from Last 3 Encounters:  09/04/23 138 lb (62.6 kg)  05/09/23 135 lb (61.2 kg)  01/24/23 138 lb 9.6 oz (62.9 kg)      ASSESSMENT AND PLAN:  1  HTN   BP is good on current regimen   It had been low in the pastt, meds adjusted    She denies dizziness  2 PAF   Pt never senses   Rhythm is regular today   Keep on current regimen  3  Hx CVA   Pt is on  Eliquis now  4  HL  Will get in touch with PCP   Last LDL was in Dec 2023 70 at that time          Follow up next winter   Sooner for problems     Current medicines are reviewed at length with the patient today.  The patient does not have concerns regarding medicines.  Signed, Dietrich Pates, MD  09/04/2023 2:21 PM    [facility] [address] [place], [place], [place]  [address] Phone: [phone]; Fax: [phone]

## 2023-09-04 ENCOUNTER — Ambulatory Visit: Payer: Medicare PPO | Attending: Internal Medicine | Admitting: Internal Medicine

## 2023-09-04 ENCOUNTER — Encounter: Payer: Self-pay | Admitting: Internal Medicine

## 2023-09-04 VITALS — BP 112/72 | HR 76 | Ht 61.0 in | Wt 138.0 lb

## 2023-09-04 DIAGNOSIS — I480 Paroxysmal atrial fibrillation: Secondary | ICD-10-CM

## 2023-09-04 NOTE — Patient Instructions (Signed)
 Medication Instructions:  Your physician recommends that you continue on your current medications as directed. Please refer to the Current Medication list given to you today.  *If you need a refill on your cardiac medications before your next appointment, please call your pharmacy*   Lab Work: NONE   If you have labs (blood work) drawn today and your tests are completely normal, you will receive your results only by: MyChart Message (if you have MyChart) OR A paper copy in the mail If you have any lab test that is abnormal or we need to change your treatment, we will call you to review the results.   Testing/Procedures: NONE    Follow-Up: At [facility], you and your health needs are our priority.  As part of our continuing mission to provide you with exceptional heart care, we have created designated Provider Care Teams.  These Care Teams include your primary Cardiologist (physician) and Advanced Practice Providers (APPs -  Physician Assistants and Nurse Practitioners) who all work together to provide you with the care you need, when you need it.  We recommend signing up for the patient portal called "MyChart".  Sign up information is provided on this After Visit Summary.  MyChart is used to connect with patients for Virtual Visits (Telemedicine).  Patients are able to view lab/test results, encounter notes, upcoming appointments, etc.  Non-urgent messages can be sent to your provider as well.   To learn more about what you can do with MyChart, go to [email or web address].    Your next appointment:    December 2025   Provider:   You may see Dietrich Pates, MD or one of the following Advanced Practice Providers on your designated Care Team:   Randall An, PA-C  Jacolyn Reedy, PA-C     Other Instructions Thank you for choosing [facility]!

## 2023-12-31 ENCOUNTER — Other Ambulatory Visit: Payer: Self-pay | Admitting: Internal Medicine

## 2023-12-31 DIAGNOSIS — I4891 Unspecified atrial fibrillation: Secondary | ICD-10-CM

## 2023-12-31 NOTE — Telephone Encounter (Signed)
 Prescription refill request for Eliquis  received. Indication:afib Last office visit:2/25 Scr:0.87  2/25 Age: 78 Weight:62.6  kg  Prescription refilled

## 2024-01-18 ENCOUNTER — Emergency Department (HOSPITAL_COMMUNITY)

## 2024-01-18 ENCOUNTER — Observation Stay (HOSPITAL_COMMUNITY)
Admission: EM | Admit: 2024-01-18 | Discharge: 2024-01-19 | Disposition: A | Discharge status: Home / Self Care | Attending: Family Medicine | Admitting: Family Medicine

## 2024-01-18 ENCOUNTER — Encounter (HOSPITAL_COMMUNITY): Payer: Self-pay

## 2024-01-18 ENCOUNTER — Other Ambulatory Visit: Payer: Self-pay

## 2024-01-18 ENCOUNTER — Inpatient Hospital Stay (HOSPITAL_COMMUNITY)

## 2024-01-18 DIAGNOSIS — R41841 Cognitive communication deficit: Secondary | ICD-10-CM | POA: Insufficient documentation

## 2024-01-18 DIAGNOSIS — I482 Chronic atrial fibrillation, unspecified: Secondary | ICD-10-CM | POA: Diagnosis present

## 2024-01-18 DIAGNOSIS — R2681 Unsteadiness on feet: Secondary | ICD-10-CM | POA: Insufficient documentation

## 2024-01-18 DIAGNOSIS — R1310 Dysphagia, unspecified: Secondary | ICD-10-CM | POA: Insufficient documentation

## 2024-01-18 DIAGNOSIS — I10 Essential (primary) hypertension: Secondary | ICD-10-CM | POA: Diagnosis not present

## 2024-01-18 DIAGNOSIS — H539 Unspecified visual disturbance: Secondary | ICD-10-CM

## 2024-01-18 DIAGNOSIS — I639 Cerebral infarction, unspecified: Secondary | ICD-10-CM | POA: Diagnosis not present

## 2024-01-18 DIAGNOSIS — Z79899 Other long term (current) drug therapy: Secondary | ICD-10-CM | POA: Diagnosis not present

## 2024-01-18 DIAGNOSIS — Z7901 Long term (current) use of anticoagulants: Secondary | ICD-10-CM | POA: Insufficient documentation

## 2024-01-18 LAB — CBC
HCT: 40.6 % (ref 36.0–46.0)
Hemoglobin: 13.9 g/dL (ref 12.0–15.0)
MCH: 32.6 pg (ref 26.0–34.0)
MCHC: 34.2 g/dL (ref 30.0–36.0)
MCV: 95.3 fL (ref 80.0–100.0)
Platelets: 207 K/uL (ref 150–400)
RBC: 4.26 MIL/uL (ref 3.87–5.11)
RDW: 13.9 % (ref 11.5–15.5)
WBC: 7.4 K/uL (ref 4.0–10.5)
nRBC: 0 % (ref 0.0–0.2)

## 2024-01-18 LAB — DIFFERENTIAL
Abs Immature Granulocytes: 0.01 K/uL (ref 0.00–0.07)
Basophils Absolute: 0 K/uL (ref 0.0–0.1)
Basophils Relative: 0 %
Eosinophils Absolute: 0 K/uL (ref 0.0–0.5)
Eosinophils Relative: 1 %
Immature Granulocytes: 0 %
Lymphocytes Relative: 30 %
Lymphs Abs: 2.2 K/uL (ref 0.7–4.0)
Monocytes Absolute: 0.5 K/uL (ref 0.1–1.0)
Monocytes Relative: 7 %
Neutro Abs: 4.6 K/uL (ref 1.7–7.7)
Neutrophils Relative %: 62 %

## 2024-01-18 LAB — COMPREHENSIVE METABOLIC PANEL WITH GFR
ALT: 33 U/L (ref 0–44)
AST: 41 U/L (ref 15–41)
Albumin: 4.5 g/dL (ref 3.5–5.0)
Alkaline Phosphatase: 87 U/L (ref 38–126)
Anion gap: 10 (ref 5–15)
BUN: 20 mg/dL (ref 8–23)
CO2: 29 mmol/L (ref 22–32)
Calcium: 10.2 mg/dL (ref 8.9–10.3)
Chloride: 102 mmol/L (ref 98–111)
Creatinine, Ser: 0.75 mg/dL (ref 0.44–1.00)
GFR, Estimated: >60 mL/min (ref >60)
Glucose, Bld: 93 mg/dL (ref 70–99)
Potassium: 3.9 mmol/L (ref 3.5–5.1)
Sodium: 141 mmol/L (ref 135–145)
Total Bilirubin: 1.3 mg/dL — ABNORMAL HIGH (ref 0.0–1.2)
Total Protein: 7.9 g/dL (ref 6.5–8.1)

## 2024-01-18 LAB — PROTIME-INR
INR: 1.2 (ref 0.8–1.2)
Prothrombin Time: 15.8 s — ABNORMAL HIGH (ref 11.4–15.2)

## 2024-01-18 LAB — APTT: aPTT: 35 s (ref 24–36)

## 2024-01-18 LAB — ETHANOL: Alcohol, Ethyl (B): <15 mg/dL (ref <15)

## 2024-01-18 MED ORDER — SODIUM CHLORIDE 0.9 % IV SOLN: INTRAVENOUS | Status: DC

## 2024-01-18 MED ORDER — ACETAMINOPHEN 650 MG RE SUPP: 650.0000 mg | RECTAL | Status: DC | PRN

## 2024-01-18 MED ORDER — ASPIRIN 325 MG PO TABS
325.0000 mg | ORAL_TABLET | Freq: Every day | ORAL | Status: DC
  Administered 2024-01-18 – 2024-01-19 (×2): 325 mg via ORAL
  Filled 2024-01-18 (×2): qty 1

## 2024-01-18 MED ORDER — IOHEXOL 350 MG/ML SOLN
75.0000 mL | Freq: Once | INTRAVENOUS | Status: AC | PRN
  Administered 2024-01-18: 75 mL via INTRAVENOUS

## 2024-01-18 MED ORDER — ASPIRIN 300 MG RE SUPP: 300.0000 mg | Freq: Every day | RECTAL | Status: DC

## 2024-01-18 MED ORDER — ACETAMINOPHEN 160 MG/5ML PO SOLN: 650.0000 mg | ORAL | Status: DC | PRN

## 2024-01-18 MED ORDER — APIXABAN 5 MG PO TABS
5.0000 mg | ORAL_TABLET | Freq: Two times a day (BID) | ORAL | Status: DC
Start: 2024-01-19 — End: 2024-01-19
  Administered 2024-01-19: 5 mg via ORAL
  Filled 2024-01-18: qty 1

## 2024-01-18 MED ORDER — SODIUM CHLORIDE 0.9% FLUSH: 3.0000 mL | Freq: Once | INTRAVENOUS | Status: DC

## 2024-01-18 MED ORDER — ACETAMINOPHEN 325 MG PO TABS: 650.0000 mg | ORAL_TABLET | ORAL | Status: DC | PRN

## 2024-01-18 MED ORDER — STROKE: EARLY STAGES OF RECOVERY BOOK: Freq: Once | Status: AC

## 2024-01-18 NOTE — ED Provider Notes (Signed)
 [facility] AT [facility] Provider Note   CSN: [id number] Arrival date & time: 01/18/24  1230     Patient presents with: Eye Problem and Gait Problem   Misty Hughes is a 78 y.o. female.  She is presenting with blurry vision that started around 7 or 730 this morning.  She said she was checking her blood pressure and could not read the numbers.  She notes that she can see fine if she covers 1 eye.  Denies double vision.  No headache numbness or weakness.  She feels a little dizzy and unsteady if she walks fast.  Prior history of A-fib on Eliquis , history of stroke hypertension hyperlipidemia.   The history is provided by the patient.  Eye Problem Location:  Both eyes Quality: blurry. Severity:  Moderate Onset quality:  Sudden Duration:  6 hours Timing:  Constant Progression:  Unchanged Chronicity:  New Relieved by:  Closing eye Associated symptoms: no double vision, no headaches, no nausea, no numbness, no vomiting and no weakness        Prior to Admission medications   Medication Sig Start Date End Date Taking? Authorizing Provider  albuterol (VENTOLIN HFA) 108 (90 Base) MCG/ACT inhaler Inhale 1-2 puffs into the lungs every 6 (six) hours as needed for wheezing or shortness of breath (asthma).    [provider]  amLODipine (NORVASC) 2.5 MG tablet Take 2.5 mg by mouth daily. 11/23/21   [provider]  atorvastatin  (LIPITOR) 40 MG tablet TAKE 1 TABLET(40 MG) BY MOUTH EVERY EVENING 07/24/23   [name], MD  Calcium  Carb-Cholecalciferol  (CALCIUM  + D3 PO) Take 1 tablet by mouth daily.    [provider]  ELIQUIS  5 MG TABS tablet TAKE 1 TABLET(5 MG) BY MOUTH TWICE DAILY 12/31/23   [name], MD  ferrous sulfate  325 (65 FE) MG tablet Take 325 mg by mouth daily with breakfast. OTC    [provider]  fexofenadine  (ALLEGRA ) 180 MG tablet Take 1 tablet (180 mg total) by mouth daily as needed for allergies or  rhinitis. Patient taking differently: Take 180 mg by mouth daily. 11/21/21   [name], [name], MD  Furosemide (LASIX PO) Take 10 mg by mouth daily.    [provider]  guaiFENesin -dextromethorphan  (ROBITUSSIN DM) 100-10 MG/5ML syrup Take 10 mLs by mouth every 8 (eight) hours. Patient not taking: Reported on 09/04/2023 08/12/22   Willette Adriana LABOR, MD  hyoscyamine  (LEVSIN  SL) 0.125 MG SL tablet Place 1 tablet (0.125 mg total) under the tongue every 4 (four) hours as needed for cramping (abdominal pain). Patient not taking: Reported on 09/04/2023 10/18/22   Haze [name], MD  ibuprofen  (ADVIL ) 600 MG tablet Take 1 tablet (600 mg total) by mouth every 6 (six) hours as needed for fever, headache, mild pain or cramping. Patient not taking: Reported on 05/09/2023 08/12/22   Willette Adriana LABOR, MD  metoprolol  tartrate (LOPRESSOR ) 25 MG tablet TAKE 1/2 TABLET(12.5 MG) BY MOUTH TWICE DAILY 05/24/23   [name], [name], MD  Multiple Vitamin (MULTIVITAMIN WITH MINERALS) TABS tablet Take 1 tablet by mouth daily.    [provider]  Omega 3-6-9 CAPS Take 1 capsule by mouth daily.    [provider]  pantoprazole  (PROTONIX ) 20 MG tablet TAKE 1 TABLET BY MOUTH EVERY DAY WHILE ON ASPIRIN  TO PROTECT STOMACH Patient not taking: Reported on 05/09/2023 08/17/22   Mona Vinie BROCKS, MD  polyethylene glycol (MIRALAX  / GLYCOLAX ) 17 g packet Take 17  g by mouth daily. 10/18/22   Haze [name], MD  potassium chloride  SA (KLOR-CON  M20) 20 MEQ tablet Take 1 tablet (20 mEq total) by mouth daily. 05/11/23 09/04/23  [name], [name], MD  Psyllium (METAMUCIL PO) Take by mouth. Patient not taking: Reported on 09/04/2023    [provider]  triamterene -hydrochlorothiazide (DYAZIDE) 37.5-25 MG capsule Take 1 capsule by mouth every morning. 06/26/23   [provider]    Allergies: Patient has no known allergies.    Review of Systems  Constitutional:  Negative for fever.  HENT:   Negative for sore throat.   Eyes:  Positive for visual disturbance. Negative for double vision.  Respiratory:  Negative for shortness of breath.   Cardiovascular:  Negative for chest pain.  Gastrointestinal:  Negative for abdominal pain, nausea and vomiting.  Genitourinary:  Negative for dysuria.  Skin:  Negative for rash.  Neurological:  Negative for weakness, numbness and headaches.    Updated Vital Signs BP (!) 174/85 (BP Location: Right Arm)   Pulse 76   Temp 98.3 F (36.8 C) (Oral)   Resp 16   Ht 5' 1 (1.549 m)   Wt 62.6 kg   SpO2 100%   BMI 26.07 kg/m   Physical Exam Vitals and nursing note reviewed.  Constitutional:      General: She is not in acute distress.    Appearance: Normal appearance. She is well-developed.  HENT:     Head: Normocephalic and atraumatic.  Eyes:     Conjunctiva/sclera: Conjunctivae normal.  Cardiovascular:     Rate and Rhythm: Normal rate and regular rhythm.     Heart sounds: No murmur heard. Pulmonary:     Effort: Pulmonary effort is normal. No respiratory distress.     Breath sounds: Normal breath sounds. No stridor. No wheezing.  Abdominal:     Palpations: Abdomen is soft.     Tenderness: There is no abdominal tenderness. There is no guarding or rebound.  Musculoskeletal:        General: No tenderness or deformity. Normal range of motion.     Cervical back: Neck supple.  Skin:    General: Skin is warm and dry.  Neurological:     General: No focal deficit present.     Mental Status: She is alert.     GCS: GCS eye subscore is 4. GCS verbal subscore is 5. GCS motor subscore is 6.     Sensory: No sensory deficit.     Motor: No weakness.     (all labs ordered are listed, but only abnormal results are displayed) Labs Reviewed  PROTIME-INR - Abnormal; Notable for the following components:      Result Value   Prothrombin Time 15.8 (*)    All other components within normal limits  COMPREHENSIVE METABOLIC PANEL WITH GFR - Abnormal;  Notable for the following components:   Total Bilirubin 1.3 (*)    All other components within normal limits  APTT  CBC  DIFFERENTIAL  ETHANOL  LIPID PANEL  HEMOGLOBIN A1C    EKG: None  Radiology: CT ANGIO HEAD NECK W WO CM Result Date: 01/18/2024 EXAM: CTA HEAD AND NECK WITH AND WITHOUT 01/18/2024 04:47:46 PM TECHNIQUE: CTA of the head and neck was performed with and without the administration of intravenous contrast. Multiplanar 2D and/or 3D reformatted images are provided for review. Automated exposure control, iterative reconstruction, and/or weight based adjustment of the mA/kV was utilized to reduce the radiation dose to as low as reasonably  achievable. Stenosis of the internal carotid arteries measured using NASCET criteria. COMPARISON: MRI brain 01/18/2024, CT head 01/18/2024. CLINICAL HISTORY: Neuro deficit, acute, stroke suspected. Neuro deficit, gait problem. Reports symptoms started around 0730 with inability to focus and difficulty staying stable while ambulating. Denies weakness, facial droop slurred speech or loss of vision. FINDINGS: CTA NECK: AORTIC ARCH AND ARCH VESSELS: No dissection or arterial injury. No significant stenosis of the brachiocephalic or subclavian arteries. CERVICAL CAROTID ARTERIES: No dissection, arterial injury, or hemodynamically significant stenosis by NASCET criteria. CERVICAL VERTEBRAL ARTERIES: No dissection, arterial injury, or significant stenosis. LUNGS AND MEDIASTINUM: Unremarkable. SOFT TISSUES: No acute abnormality. BONES: No acute abnormality. CTA HEAD: ANTERIOR CIRCULATION: Atherosclerotic calcifications of the ICA siphons without significant stenosis or aneurysm. POSTERIOR CIRCULATION: Persistent fetal origin of the left PCA. No significant stenosis of the posterior cerebral arteries. No significant stenosis of the basilar artery. No significant stenosis of the vertebral arteries. No aneurysm. OTHER: No dural venous sinus thrombosis on this  non-dedicated study. IMPRESSION: 1. No large vessel occlusion, hemodynamically significant stenosis, or aneurysm in the head or neck. 2. Atherosclerotic calcifications of the ICA siphons without significant stenosis or aneurysm. Electronically signed by: Ryan Chess MD 01/18/2024 04:57 PM EDT RP Workstation: [id number]   MR BRAIN WO CONTRAST Result Date: 01/18/2024 EXAM: MRI BRAIN WITHOUT CONTRAST 01/18/2024 02:43:47 PM TECHNIQUE: Multiplanar multisequence MRI of the head/brain was performed without the administration of intravenous contrast. COMPARISON: Head CT 01/18/2024, MRI brain 11/21/2021 CLINICAL HISTORY: Neuro deficit, acute, stroke suspected. Reports symptoms started around 0730 with inability to focus and difficulty staying stable while ambulating. Denies weakness, facial droop slurred speech or loss of vision. FINDINGS: BRAIN AND VENTRICLES: 2 foci of acute infarction in the right occipital lobe seen on images 22 and 27 of series 5. Old infarct in the inferior right parietal lobe. Background mild chronic small vessel disease. No significant mass effect or midline shift. Chronic micro hemorrhage in the right cerebellar hemisphere. ORBITS: No acute abnormality. SINUSES AND MASTOIDS: No acute abnormality. BONES AND SOFT TISSUES: Normal marrow signal. No acute soft tissue abnormality. IMPRESSION: 1. Acute infarction in the right occipital lobe. 2. Old infarct in the inferior right parietal lobe. 3. Mild chronic small vessel disease. Electronically signed by: Ryan Chess MD 01/18/2024 03:23 PM EDT RP Workstation: [id number]   CT HEAD WO CONTRAST Result Date: 01/18/2024 CLINICAL DATA:  Neuro deficit, acute, stroke suspected EXAM: CT HEAD WITHOUT CONTRAST TECHNIQUE: Contiguous axial images were obtained from the base of the skull through the vertex without intravenous contrast. RADIATION DOSE REDUCTION: This exam was performed according to the departmental dose-optimization program which includes  automated exposure control, adjustment of the mA and/or kV according to patient size and/or use of iterative reconstruction technique. COMPARISON:  CT head dated Nov 20, 2021. FINDINGS: Brain: There are chronic encephalomalacia changes again noted within the right parietal lobe. The brain is otherwise unremarkable. No evidence of hemorrhage, mass, acute cortical infarct or hydrocephalus. Vascular: Mild calcific atheromatous disease. Skull: Intact and unremarkable. Sinuses/Orbits: The visualized paranasal sinuses are clear. Status post bilateral lens replacement. Other: None. IMPRESSION: 1. 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 Electronically Signed   By: Evalene Coho M.D.   On: 01/18/2024 13:03     Procedures   Medications Ordered in the ED  sodium chloride  flush (NS) 0.9 % injection 3 mL (3 mLs Intravenous Not Given 01/18/24 1401)  apixaban  (ELIQUIS ) tablet 5 mg (has no administration in time range)   stroke: early stages  of recovery book (has no administration in time range)  0.9 %  sodium chloride  infusion (has no administration in time range)  acetaminophen  (TYLENOL ) tablet 650 mg (has no administration in time range)    Or  acetaminophen  (TYLENOL ) 160 MG/5ML solution 650 mg (has no administration in time range)    Or  acetaminophen  (TYLENOL ) suppository 650 mg (has no administration in time range)  aspirin  suppository 300 mg (has no administration in time range)    Or  aspirin  tablet 325 mg (has no administration in time range)  iohexol  (OMNIPAQUE ) 350 MG/ML injection 75 mL (75 mLs Intravenous Contrast Given 01/18/24 1641)    Clinical Course as of 01/18/24 1803  Fri Jan 18, 2024  1529 Updated patient on results of MRI and the need to get an IV placed for an angio and admission to the hospital.  Patient in agreement with plan [MB]  1538 I updated Dr. Voncile neurology and he is including Dr. Jerrie neurohospitalist at [facility] who will see in consult. [MB]     Clinical Course User Index [MB] Towana Ozell BROCKS, MD                                 Medical Decision Making Amount and/or Complexity of Data Reviewed Labs: ordered. Radiology: ordered.  Risk Prescription drug management. Decision regarding hospitalization.   This patient complains of acute visual disturbance; this involves an extensive number of treatment Options and is a complaint that carries with it a high risk of complications and morbidity. The differential includes stroke, bleed, migraine, hyperglycemia, ocular problem  I ordered, reviewed and interpreted labs, which included CBC normal chemistries normal I ordered imaging studies which included CT head, MRI brain and I independently    visualized and interpreted imaging which showed acute occipital infarct Additional history obtained from patient's husband Previous records obtained and reviewed in epic including prior PCP notes I consulted neurology Dr. Voncile and discussed lab and imaging findings and discussed disposition.  Cardiac monitoring reviewed, sinus rhythm Social determinants considered, no significant barriers Critical Interventions: None  After the interventions stated above, I reevaluated the patient and found patient to be fairly asymptomatic Admission and further testing considered, she would benefit from mission to the hospital for further workup.  Have ordered her CT angio and discussed with [facility] hospitalist Dr. Barbra.      Final diagnoses:  Acute CVA (cerebrovascular accident) [facility])  Visual disturbance    ED Discharge Orders     None          Towana Ozell BROCKS, MD 01/18/24 1806

## 2024-01-18 NOTE — Progress Notes (Addendum)
 Pt arrived to room 317 via WC from ED. Pt ambulatory from chair to foot scale and from foot scale to bed with standby assistance. A&O x4, denies any c/o pain, dizziness or headache.  Oriented to room and safety precautions, call bell within reach and advised to call for needs. Bed alarm on for safety. Pt and daughter state understanding.  Pt states that she presented to ED today due to blurry vision. She has clear vision from left eye and from right eye individually, but when looking with both eyes, pt states vision is still blurry. Also, pt with loss of peripheral vision in left eye, verified x2.

## 2024-01-18 NOTE — H&P (Signed)
 History and Physical    Patient: Misty Hughes FMW:[id number] DOB: 11/14/1945 DOA: 01/18/2024 DOS: the patient was seen and examined on 01/18/2024 PCP: Pcp, No  Patient coming from: Home  Chief Complaint:  Chief Complaint  Patient presents with   Eye Problem   Gait Problem   HPI: Misty Hughes is a 78 y.o. female with medical history significant of hypertension, hyperlipidemia, history of right parietal stroke.  Patient comes in with blurred vision that started earlier today.  She came to the hospital for evaluation and was found to have a right occipital lobe stroke.  Since being here, her vision has improved a little bit.  She is able to see things little more clearly, but they are still blurred.  Denies fevers, chills, nausea, vomiting, arm and leg weakness, sensory changes, dysarthria.  Review of Systems: As mentioned in the history of present illness. All other systems reviewed and are negative. Past Medical History:  Diagnosis Date   Allergic rhinitis    Hyperlipidemia LDL goal <70    Hypertension    Stroke (HCC) 11/21/2021   Past Surgical History:  Procedure Laterality Date   COLONOSCOPY WITH PROPOFOL  N/A 09/01/2022   Procedure: COLONOSCOPY WITH PROPOFOL ;  Surgeon: Eartha Angelia Sieving, MD;  [facility];  Service: Gastroenterology;  Laterality: N/A;  7:30am, asa 3   HERNIA REPAIR N/A    POLYPECTOMY  09/01/2022   Procedure: POLYPECTOMY;  Surgeon: Eartha Angelia Sieving, MD;  [facility]: [facility];  Service: Gastroenterology;;   Social History:  reports that she has never smoked. She has never used smokeless tobacco. She reports that she does not drink alcohol and does not use drugs.  No Known Allergies  Family History  Problem Relation Age of Onset   Hypertension Mother    Hypertension Sister    Sleep apnea Neg Hx     Prior to Admission medications   Medication Sig Start Date End Date Taking? Authorizing Provider  albuterol (VENTOLIN HFA) 108  (90 Base) MCG/ACT inhaler Inhale 1-2 puffs into the lungs every 6 (six) hours as needed for wheezing or shortness of breath (asthma).   Yes [provider]  amLODipine (NORVASC) 2.5 MG tablet Take 2.5 mg by mouth daily. 11/23/21  Yes [provider]  atorvastatin  (LIPITOR) 40 MG tablet TAKE 1 TABLET(40 MG) BY MOUTH EVERY EVENING 07/24/23  Yes [name], MD  Calcium  Carb-Cholecalciferol  (CALCIUM  + D3 PO) Take 1 tablet by mouth daily.   Yes [provider]  ELIQUIS  5 MG TABS tablet TAKE 1 TABLET(5 MG) BY MOUTH TWICE DAILY 12/31/23  Yes [name], MD  ferrous sulfate  325 (65 FE) MG tablet Take 325 mg by mouth daily with breakfast. OTC   Yes [provider]  fexofenadine  (ALLEGRA ) 180 MG tablet Take 1 tablet (180 mg total) by mouth daily as needed for allergies or rhinitis. Patient taking differently: Take 180 mg by mouth daily. 11/21/21  Yes [name], [name], MD  furosemide (LASIX) 20 MG tablet Take 10 mg by mouth daily. 12/25/23  Yes [provider]  metoprolol  tartrate (LOPRESSOR ) 25 MG tablet TAKE 1/2 TABLET(12.5 MG) BY MOUTH TWICE DAILY 05/24/23  Yes [name], [name], MD  Multiple Vitamin (MULTIVITAMIN WITH MINERALS) TABS tablet Take 1 tablet by mouth daily.   Yes [provider]  Omega 3-6-9 CAPS Take 1 capsule by mouth daily.   Yes [provider]  polyethylene glycol (MIRALAX  / GLYCOLAX ) 17 g packet Take 17 g by mouth daily.  10/18/22  Yes Pollina, [name], MD  potassium chloride  SA (KLOR-CON  M20) 20 MEQ tablet Take 1 tablet (20 mEq total) by mouth daily. 05/11/23 01/18/24 Yes [name], MD  triamterene -hydrochlorothiazide (DYAZIDE) 37.5-25 MG capsule Take 1 capsule by mouth every morning. 06/26/23  Yes [provider]    Physical Exam: Vitals:   01/18/24 1244 01/18/24 1245 01/18/24 1630 01/18/24 1730  BP:  (!) 174/85 (!) 165/106 (!) 149/80  Pulse:  76 75 62  Resp:  16 11 19   Temp:  98.3 F (36.8 C)  98.2 F  (36.8 C)  TempSrc:  Oral  Oral  SpO2:  100% 100% 94%  Weight: 62.6 kg     Height: 5' 1 (1.549 m)      General: Elderly female. Awake and alert and oriented x3. No acute cardiopulmonary distress.  HEENT: Normocephalic atraumatic.  Right and left ears normal in appearance.  Pupils equal, round, reactive to light. Extraocular muscles are intact. Sclerae anicteric and noninjected.  Moist mucosal membranes. No mucosal lesions.  Neck: Neck supple without lymphadenopathy. No carotid bruits. No masses palpated.  Cardiovascular: Irregular rate with normal S1-S2 sounds. No murmurs, rubs, gallops auscultated. No JVD.  Respiratory: Good respiratory effort with no wheezes, rales, rhonchi. Lungs clear to auscultation bilaterally.  No accessory muscle use. Abdomen: Soft, nontender, nondistended. Active bowel sounds. No masses or hepatosplenomegaly  Skin: No rashes, lesions, or ulcerations.  Dry, warm to touch. 2+ dorsalis pedis and radial pulses. Musculoskeletal: No calf or leg pain. All major joints not erythematous nontender.  No upper or lower joint deformation.  Good ROM.  No contractures  Psychiatric: Intact judgment and insight. Pleasant and cooperative. Neurologic: No focal neurological deficits. Strength is 5/5 and symmetric in upper and lower extremities.  Cranial nerves III through XII are grossly intact.  Continues to have blurred vision  Data Reviewed: I have reviewed the imaging and labs ordered by the ER physician.  Assessment and Plan: No notes have been filed under this hospital service. Service: Hospitalist  Principal Problem:   Occipital stroke [facility]) Active Problems:   Essential hypertension   Chronic a-fib (HCC)  Occipital stroke Admit on telemetry Due to small size of the stroke, neurology felt that the patient could safely have the remainder of her workup done at our hospital, which I agree to. MRI/MRA head done CTA head and neck done Echocardiogram tomorrow Hemoglobin  A1c, lipid panel in the morning PT/OT/speech therapy consult Full aspirin  Permissive hypertension (hold antihypertensives) Patient failed swallow eval, n.p.o. overnight.  Will have speech therapy evaluate tomorrow morning Hold Eliquis  tonight and restart tomorrow Hypertension Antihypertensives on hold Chronic A-fib Currently rate controlled   Advance Care Planning:   Code Status: Full Code discussed with patient  Consults: Neurology  Family Communication: Husband present during interview and exam  Severity of Illness: The appropriate patient status for this patient is INPATIENT. Inpatient status is judged to be reasonable and necessary in order to provide the required intensity of service to ensure the patient's safety. The patient's presenting symptoms, physical exam findings, and initial radiographic and laboratory data in the context of their chronic comorbidities is felt to place them at high risk for further clinical deterioration. Furthermore, it is not anticipated that the patient will be medically stable for discharge from the hospital within 2 midnights of admission.   * I certify that at the point of admission it is my clinical judgment that the patient will require inpatient hospital care spanning beyond 2  midnights from the point of admission due to high intensity of service, high risk for further deterioration and high frequency of surveillance required.*  Author: Zykiria Bruening J Miu Chiong, DO 01/18/2024 5:46 PM  For on call review [email or web address].

## 2024-01-18 NOTE — Discharge Instructions (Signed)
 You were seen in the emergency department for blurry vision.  You had lab work CAT scan and an MRI of your brain.  Please follow-up with your eye specialist for further evaluation.  Continue your regular medications.  Return if any worsening or concerning symptoms

## 2024-01-18 NOTE — ED Triage Notes (Signed)
 Reports symptoms started around 0730 with inability to focus and difficulty staying stable while ambulating.  Denies weakness, facial droop slurrred speech of loss of vision.

## 2024-01-18 NOTE — Plan of Care (Signed)
 On-call neurology note  Patient with a very small punctate occipital stroke. She is on Eliquis  for A-fib I would recommend admission for stroke workup to include CT angio head and neck, 2D echo, A1c, lipid panel, frequent neurochecks and therapy assessments. The bed situation at this time is causing significant amount of delays for patients to be transferred to [facility].  Since she is already on Eliquis  is causing significant amount of delays for patients to be transferred to [facility].  Since she is already on Eliquis  and her stroke is small, I would recommend that she be admitted to [facility], the workup completed and the hospitalist team can reach out to me over the phone for final set of recommendations and have her follow outpatient neurology after discharge. I discussed this with the EDP, admitting hospitalist Dr. Barbra and Jolynn Pack neurologist Dr. Jerrie. We are all in agreement   -- Eligio Lav, MD Neurologist [facility]

## 2024-01-18 NOTE — ED Provider Triage Note (Signed)
 Emergency Medicine Provider Triage Evaluation Note  Misty Hughes , a 78 y.o. female  was evaluated in triage.  Pt complains of ocular blurry vision, though when she covers each eye output she has normal vision and feels slightly off balance.  Started around 730 this morning after she had exercised and taking all of her medications including her Eliquis .  She denies any numbness tingling or weakness, denies headache, chest pain, shortness of breath or other complaints.  She does have history of stroke.  Has never had these symptoms before..  Review of Systems  Positive: As above Negative: As above  Physical Exam  BP (!) 174/85 (BP Location: Right Arm)   Pulse 76   Temp 98.3 F (36.8 C) (Oral)   Resp 16   Ht 5' 1 (1.549 m)   Wt 62.6 kg   SpO2 100%   BMI 26.07 kg/m  Gen:   Awake, no distress   Resp:  Normal effort  MSK:   Moves extremities without difficulty  Other:  Normal strength bilateral upper and lower extremities, normal sensation, no facial droop, dysarthria, aphasia.  Medical Decision Making  Medically screening exam initiated at 12:56 PM.  Appropriate orders placed.  Saffron Busey was informed that the remainder of the evaluation will be completed by another provider, this initial triage assessment does not replace that evaluation, and the importance of remaining in the ED until their evaluation is complete.  Patient presents with acute onset of blurry vision and per nurse was slightly off balance with walking and she does have a history of stroke.  She however is not TNK candidate, this was over 5 hours ago and also she took her Eliquis  this morning prior to this episode starting.  Will obtain labs and CT   Suellen Sherran LABOR, PA-C 01/18/24 1258

## 2024-01-18 NOTE — ED Notes (Signed)
Pt refused IV at this time

## 2024-01-18 NOTE — ED Notes (Signed)
 Pt transported to CT via stretcher.

## 2024-01-19 ENCOUNTER — Inpatient Hospital Stay (HOSPITAL_BASED_OUTPATIENT_CLINIC_OR_DEPARTMENT_OTHER)

## 2024-01-19 DIAGNOSIS — I63531 Cerebral infarction due to unspecified occlusion or stenosis of right posterior cerebral artery: Secondary | ICD-10-CM | POA: Diagnosis not present

## 2024-01-19 DIAGNOSIS — I639 Cerebral infarction, unspecified: Secondary | ICD-10-CM | POA: Diagnosis not present

## 2024-01-19 LAB — LIPID PANEL
Cholesterol: 125 mg/dL (ref 0–200)
HDL: 62 mg/dL (ref >40)
LDL Cholesterol: 57 mg/dL (ref 0–99)
Total CHOL/HDL Ratio: 2 ratio
Triglycerides: 28 mg/dL (ref <150)
VLDL: 6 mg/dL (ref 0–40)

## 2024-01-19 LAB — ECHOCARDIOGRAM COMPLETE
Height: 61 in
S' Lateral: 2.1 cm
Weight: 2091.72 [oz_av]

## 2024-01-19 MED ORDER — FERROUS SULFATE 325 (65 FE) MG PO TABS
325.0000 mg | ORAL_TABLET | Freq: Every day | ORAL | Status: DC
  Administered 2024-01-19: 325 mg via ORAL
  Filled 2024-01-19: qty 1

## 2024-01-19 MED ORDER — ASPIRIN 325 MG PO TABS: 325.0000 mg | ORAL_TABLET | Freq: Every day | ORAL | 0 refills | Status: DC

## 2024-01-19 MED ORDER — METOPROLOL TARTRATE 25 MG PO TABS
12.5000 mg | ORAL_TABLET | Freq: Two times a day (BID) | ORAL | Status: DC
  Administered 2024-01-19: 12.5 mg via ORAL
  Filled 2024-01-19: qty 1

## 2024-01-19 MED ORDER — FAMOTIDINE 20 MG PO TABS: 20.0000 mg | ORAL_TABLET | Freq: Two times a day (BID) | ORAL | 1 refills | Status: DC

## 2024-01-19 MED ORDER — ASPIRIN 81 MG PO TBEC: 81.0000 mg | DELAYED_RELEASE_TABLET | Freq: Every day | ORAL | 2 refills | Status: DC

## 2024-01-19 MED ORDER — ATORVASTATIN CALCIUM 40 MG PO TABS
40.0000 mg | ORAL_TABLET | Freq: Every day | ORAL | Status: DC
  Administered 2024-01-19: 40 mg via ORAL
  Filled 2024-01-19: qty 1

## 2024-01-19 MED ORDER — OYSTER SHELL CALCIUM/D3 500-5 MG-MCG PO TABS
1.0000 | ORAL_TABLET | Freq: Every day | ORAL | Status: DC
  Administered 2024-01-19: 1 via ORAL
  Filled 2024-01-19: qty 1

## 2024-01-19 NOTE — Progress Notes (Signed)
 On Call Neurology Note  Patient with history of atrial fibrillation presented to [facility] for visual disturbances.  MRI of the brain revealed a very small punctate occipital infarct. Case was discussed over the phone with me. Given the current excessive delays in patient transfers to the tertiary care center, recommended patient receive workup at [facility] with admission to the hospitalist service.  Stroke workup as follows: Lipid panel-LDL 57.  She is on home atorvastatin  40 mg daily.  Continue that. A1c-pending-goal less than 7. 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  Aortic valve is grossly normal.  No atrial level shunt detected by color-flow Doppler CTA head and neck-no ELVO.  Atherosclerotic calcifications of the ICA siphons without significant stenosis or aneurysm.  Impression: Acute ischemic punctate right occipital stroke.  Etiology likely cardioembolic  Recommendations Therapy assessments Continue her home anticoagulation Continue statin Follow-up with outpatient neurology in 8 to 12 weeks Plan relayed to Dr. Willette  -- Eligio Lav, MD Neurology

## 2024-01-19 NOTE — Progress Notes (Signed)
  Echocardiogram 2D Echocardiogram has been performed.  Misty Hughes 01/19/2024, 12:48 PM

## 2024-01-19 NOTE — Plan of Care (Signed)
  Problem: Activity: Goal: Risk for activity intolerance will decrease Outcome: Progressing   Problem: Clinical Measurements: Goal: Respiratory complications will improve Outcome: Adequate for Discharge   

## 2024-01-19 NOTE — Hospital Course (Signed)
 Misty Hughes is a 78 y.o. female with medical history significant of hypertension, hyperlipidemia, history of right parietal stroke.  Patient comes in with blurred vision that started earlier today.  She came to the hospital for evaluation and was found to have a right occipital lobe stroke.  Since being here, her vision has improved a little bit.  She is able to see things little more clearly, but they are still blurred.  Denies fevers, chills, nausea, vomiting, arm and leg weakness, sensory changes, dysarthria.    Principal Problem:   Occipital stroke [facility]) Active Problems:   Essential hypertension   Chronic a-fib (HCC)  Occipital stroke  - Due to small size of the stroke, neurology felt that the patient could safely have the remainder of her workup done at our hospital, which I agree to. - MRI/MRA head done - CTA head and neck done - Echocardiogram tomorrow - Hemoglobin A1c, lipid panel in the morning  PT/OT/speech therapy consult Full aspirin  Permissive hypertension (hold antihypertensives)  Patient failed swallow eval, n.p.o. overnight.  Will have speech therapy evaluate tomorrow morning  Hold Eliquis  tonight and restart tomorrow  Hypertension Antihypertensives on hold  Chronic A-fib Currently rate controlled  Consults: Neurology

## 2024-01-19 NOTE — Plan of Care (Signed)

## 2024-01-19 NOTE — Evaluation (Signed)
 Clinical/Bedside Swallow Evaluation Patient Details  Name: Misty Hughes MRN: [id number] Date of Birth: [birth year]  Today's Date: 01/19/2024 Time: SLP Start Time (ACUTE ONLY): 1314 SLP Stop Time (ACUTE ONLY): 1322 SLP Time Calculation (min) (ACUTE ONLY): 8 min  Past Medical History:  Past Medical History:  Diagnosis Date   Allergic rhinitis    Hyperlipidemia LDL goal <70    Hypertension    Stroke (HCC) 11/21/2021   Past Surgical History:  Past Surgical History:  Procedure Laterality Date   COLONOSCOPY WITH PROPOFOL  N/A 09/01/2022   Procedure: COLONOSCOPY WITH PROPOFOL ;  Surgeon: Misty Angelia Sieving, MD;  [facility];  Service: Gastroenterology;  Laterality: N/A;  7:30am, asa 3   HERNIA REPAIR N/A    POLYPECTOMY  09/01/2022   Procedure: POLYPECTOMY;  Surgeon: Misty Angelia Sieving, MD;  [facility]: [facility];  Service: Gastroenterology;;   HPI:  Misty Hughes is a 78 yo female presenting to ED 7/11 with blurred vision. MRI shows R occipital stroke. PMH includes HTN, HLD, prior R parietal stroke    Assessment / Plan / Recommendation  Clinical Impression  Pt initially coughed during the swallow screen but passed once readministered. She denies difficulty swallowing on current diet or PTA. There are no focal facial/lingual weakness. Observed pt complete the 3 oz water test and eat regular solids without overt s/s of dysphagia or aspiration. Continue current diet and SLP will sign off at this time. SLP Visit Diagnosis: Dysphagia, unspecified (R13.10)    Aspiration Risk  Mild aspiration risk    Diet Recommendation Regular;Thin liquid    Liquid Administration via: Cup;Straw Medication Administration: Whole meds with liquid Supervision: Patient able to self feed Compensations: Minimize environmental distractions;Slow rate;Small sips/bites Postural Changes: Seated upright at 90 degrees    Other  Recommendations Oral Care Recommendations: Oral care BID      Assistance Recommended at Discharge    Functional Status Assessment Patient has not had a recent decline in their functional status  Frequency and Duration            Prognosis Prognosis for improved oropharyngeal function: Good Barriers to Reach Goals: Cognitive deficits      Swallow Study   General HPI: Misty Hughes is a 78 yo female presenting to ED 7/11 with blurred vision. MRI shows R occipital stroke. PMH includes HTN, HLD, prior R parietal stroke Type of Study: Bedside Swallow Evaluation Previous Swallow Assessment: none in chart Diet Prior to this Study: Regular;Thin liquids (Level 0) Temperature Spikes Noted: No Respiratory Status: Room air History of Recent Intubation: No Behavior/Cognition: Alert;Cooperative;Pleasant mood Oral Cavity Assessment: Within Functional Limits Oral Care Completed by SLP: No Oral Cavity - Dentition: Adequate natural dentition Vision: Functional for self-feeding Self-Feeding Abilities: Able to feed self Patient Positioning: Upright in bed Baseline Vocal Quality: Normal Volitional Cough: Strong Volitional Swallow: Able to elicit    Oral/Motor/Sensory Function Overall Oral Motor/Sensory Function: Within functional limits   Ice Chips Ice chips: Not tested   Thin Liquid Thin Liquid: Within functional limits Presentation: Cup;Self Fed    Nectar Thick Nectar Thick Liquid: Not tested   Honey Thick Honey Thick Liquid: Not tested   Puree Puree: Not tested   Solid     Solid: Within functional limits Presentation: Self Fed      Misty Hughes, M.A., CCC-SLP Speech Language Pathology, Acute Rehabilitation Services  Secure Chat preferred [phone]  01/19/2024,1:33 PM

## 2024-01-19 NOTE — Evaluation (Signed)
 Speech Language Pathology Evaluation Patient Details Name: Misty Hughes MRN: [id number] DOB: 11-16-1945 Today's Date: 01/19/2024 Time: 8677-8669 SLP Time Calculation (min) (ACUTE ONLY): 8 min  Problem List:  Patient Active Problem List   Diagnosis Date Noted   Occipital stroke (HCC) 01/18/2024   Encounter for screening colonoscopy 09/01/2022   Sepsis (HCC) 08/11/2022   Chronic a-fib (HCC) 08/11/2022   Transaminitis 08/11/2022   HLD (hyperlipidemia) 08/11/2022   Acute CVA (cerebrovascular accident) (HCC) 11/21/2021   Essential hypertension 11/21/2021   Speech abnormality 11/20/2021   Unilateral primary osteoarthritis, right knee 07/20/2021   Past Medical History:  Past Medical History:  Diagnosis Date   Allergic rhinitis    Hyperlipidemia LDL goal <70    Hypertension    Stroke (HCC) 11/21/2021   Past Surgical History:  Past Surgical History:  Procedure Laterality Date   COLONOSCOPY WITH PROPOFOL  N/A 09/01/2022   Procedure: COLONOSCOPY WITH PROPOFOL ;  Surgeon: Eartha Angelia Sieving, MD;  [facility];  Service: Gastroenterology;  Laterality: N/A;  7:30am, asa 3   HERNIA REPAIR N/A    POLYPECTOMY  09/01/2022   Procedure: POLYPECTOMY;  Surgeon: Eartha Angelia Sieving, MD;  [facility]: [facility];  Service: Gastroenterology;;   HPI:  Misty Hughes is a 78 yo female presenting to ED 7/11 with blurred vision. MRI shows R occipital stroke. PMH includes HTN, HLD, prior R parietal stroke   Assessment / Plan / Recommendation Clinical Impression  Pt reports she was independent PTA, living at home with her spouse. She denies acute concerns with cognition but had increased difficulty with portions of the Cognistat related to memory and reasoning. Pt was unable to recall any of the four novel words after a delay but was able to recall 2/4 given multiple choices. Pt performed [facility] on concrete reasoning tasks but was unsuccessful when challenged with abstract concepts.  She states she is at her baseline and her family confirms. Ongoing SLP f/u is not necessary acutely but may be considered on an OP basis. Will sign off at this time.    SLP Assessment  SLP Recommendation/Assessment: All further Speech Language Pathology needs can be addressed in the next venue of care SLP Visit Diagnosis: Cognitive communication deficit (R41.841)     Assistance Recommended at Discharge  PRN  Functional Status Assessment Patient has had a recent decline in their functional status and demonstrates the ability to make significant improvements in function in a reasonable and predictable amount of time.  Frequency and Duration           SLP Evaluation Cognition  Overall Cognitive Status: Impaired/Different from baseline Arousal/Alertness: Awake/alert Orientation Level: Oriented X4 Attention: Selective Selective Attention: Appears intact Memory: Impaired Memory Impairment: Retrieval deficit;Decreased short term memory Decreased Short Term Memory: Verbal basic Awareness: Appears intact Problem Solving: Appears intact Executive Function: Reasoning Reasoning: Impaired Reasoning Impairment: Verbal complex       Comprehension  Auditory Comprehension Overall Auditory Comprehension: Appears within functional limits for tasks assessed    Expression Expression Primary Mode of Expression: Verbal Verbal Expression Overall Verbal Expression: Appears within functional limits for tasks assessed   Oral / Motor  Oral Motor/Sensory Function Overall Oral Motor/Sensory Function: Within functional limits Motor Speech Overall Motor Speech: Appears within functional limits for tasks assessed            Damien Blumenthal, M.A., CCC-SLP Speech Language Pathology, Acute Rehabilitation Services  Secure Chat preferred [phone]  01/19/2024, 1:38 PM

## 2024-01-19 NOTE — Plan of Care (Signed)
   Problem: Activity: Goal: Risk for activity intolerance will decrease Outcome: Progressing

## 2024-01-19 NOTE — Discharge Summary (Signed)
 Physician Discharge Summary   Patient: Misty Hughes MRN: [id number] DOB: Apr 13, 1946  Admit date:     01/18/2024  Discharge date: 01/19/24  Discharge Physician: Adriana DELENA Grams   PCP: Pcp, No   Recommendations at discharge:   Follow-up with the PCP in 2-4 weeks Follow-up with outpatient neurology as soon as possible or within 1 week: Per inpatient neurology continue Eliquis  with statins and No additional dose of aspirin , - outpatient follow-up and further recommendation Instructed to hold hydrochlorothiazide for now   Discharge Diagnoses: Principal Problem:   Occipital stroke [facility]) Active Problems:   Essential hypertension   Chronic a-fib (HCC)  Resolved Problems:   * No resolved hospital problems. *  Hospital Course: Misty Hughes is a 78 y.o. female with medical history significant of hypertension, hyperlipidemia, history of right parietal stroke.  Patient comes in with blurred vision that started earlier today.  She came to the hospital for evaluation and was found to have a right occipital lobe stroke.  Since being here, her vision has improved a little bit.  She is able to see things little more clearly, but they are still blurred.  Denies fevers, chills, nausea, vomiting, arm and leg weakness, sensory changes, dysarthria.   Occipital stroke  - Due to small size of the stroke,  The case was discussed in detail on admission and today with neurologist Dr. Deedra who feels safe for patient to be discharged on full dose aspirin , Eliquis  and statins - Follow-up as an outpatient for further evaluation recommendations  Neurologist is reviewed all imaging - MRI/MRA head done-reviewed (small Rt occipital strok) - CTA head and neck done-reviewed (head and neck-no ELVO. Atherosclerotic calcifications of the ICA siphons without significant stenosis or aneurysm)  All consistent with: Very small acute infarction in the right occipital lobe. Old infarct in the inferior right parietal  lobe and Mild chronic small vessel disease.  - Echo-reviewo-LVEF 60 to 65%, left atrium severely dilated, right atrium severely dilated, mitral valve is degenerative with trivial mitral regurgitation, suspect atrial functional TR with moderate to severe tricuspid regurgitation. Aortic valve is grossly normal. No atrial level shunt detected by color-flow Doppler CTA head and neck-no ELVO. Atherosclerotic calcifications of the ICA siphons without significant stenosis or aneurysm. ed;   - LDL 57,  PT/OT/speech therapy consult  Per neurology recommendation patient is to continue Eliquis  + Lipitor No additional dose of aspirin   Permissive hypertension -hold HCTZ, continue Lopressor   -Tolerating p.o.-passed swallow evaluation   Hypertension Continue metoprolol , Hold HCTZ  Chronic A-fib Continue metoprolol  and Eliquis   Consults: Neurology Dr. Voncile    Procedures performed: CT a, MRI/MRA head and neck, echo Disposition: Home Diet recommendation:  Discharge Diet Orders (From admission, onward)     Start     Ordered   01/19/24 0000  Diet - low sodium heart healthy        01/19/24 1210           Cardiac diet DISCHARGE MEDICATION: Allergies as of 01/19/2024   No Known Allergies      Medication List     PAUSE taking these medications    triamterene -hydrochlorothiazide 37.5-25 MG capsule Wait to take this until: January 25, 2024 Commonly known as: DYAZIDE Take 1 capsule by mouth every morning.       TAKE these medications    albuterol 108 (90 Base) MCG/ACT inhaler Commonly known as: VENTOLIN HFA Inhale 1-2 puffs into the lungs every 6 (six) hours as needed for wheezing or shortness of breath (  asthma).   amLODipine 2.5 MG tablet Commonly known as: NORVASC Take 2.5 mg by mouth daily.   aspirin  325 MG tablet Take 1 tablet (325 mg total) by mouth daily. Start taking on: January 20, 2024   atorvastatin  40 MG tablet Commonly known as: LIPITOR TAKE 1 TABLET(40 MG) BY  MOUTH EVERY EVENING   CALCIUM  + D3 PO Take 1 tablet by mouth daily.   Eliquis  5 MG Tabs tablet Generic drug: apixaban  TAKE 1 TABLET(5 MG) BY MOUTH TWICE DAILY   famotidine  20 MG tablet Commonly known as: PEPCID  Take 1 tablet (20 mg total) by mouth 2 (two) times daily.   ferrous sulfate  325 (65 FE) MG tablet Take 325 mg by mouth daily with breakfast. OTC   fexofenadine  180 MG tablet Commonly known as: ALLEGRA  Take 1 tablet (180 mg total) by mouth daily as needed for allergies or rhinitis. What changed: when to take this   furosemide 20 MG tablet Commonly known as: LASIX Take 10 mg by mouth daily.   metoprolol  tartrate 25 MG tablet Commonly known as: LOPRESSOR  TAKE 1/2 TABLET(12.5 MG) BY MOUTH TWICE DAILY   multivitamin with minerals Tabs tablet Take 1 tablet by mouth daily.   Omega 3-6-9 Caps Take 1 capsule by mouth daily.   polyethylene glycol 17 g packet Commonly known as: MIRALAX  / GLYCOLAX  Take 17 g by mouth daily.   potassium chloride  SA 20 MEQ tablet Commonly known as: Klor-Con  M20 Take 1 tablet (20 mEq total) by mouth daily.        Discharge Exam: Filed Weights   01/18/24 1244 01/18/24 1755  Weight: 62.6 kg 59.3 kg      General:  AAO x 3,  cooperative, no distress;   HEENT:  Normocephalic, PERRL, otherwise with in Normal limits   Neuro:  CNII-XII intact. , normal motor and sensation, reflexes intact   Lungs:   Clear to auscultation BL, Respirations unlabored,  No wheezes / crackles  Cardio:    S1/S2, RRR, No murmure, No Rubs or Gallops   Abdomen:  Soft, non-tender, bowel sounds active all four quadrants, no guarding or peritoneal signs.  Muscular  skeletal:  Limited exam -global generalized weaknesses - in bed, able to move all 4 extremities,   2+ pulses,  symmetric, No pitting edema  Skin:  Dry, warm to touch, negative for any Rashes,  Wounds: Please see nursing documentation       Condition at discharge: good  The results of  significant diagnostics from this hospitalization (including imaging, microbiology, ancillary and laboratory) are listed below for reference.   Imaging Studies: CT ANGIO HEAD NECK W WO CM Result Date: 01/18/2024 EXAM: CTA HEAD AND NECK WITH AND WITHOUT 01/18/2024 04:47:46 PM TECHNIQUE: CTA of the head and neck was performed with and without the administration of intravenous contrast. Multiplanar 2D and/or 3D reformatted images are provided for review. Automated exposure control, iterative reconstruction, and/or weight based adjustment of the mA/kV was utilized to reduce the radiation dose to as low as reasonably achievable. Stenosis of the internal carotid arteries measured using NASCET criteria. COMPARISON: MRI brain 01/18/2024, CT head 01/18/2024. CLINICAL HISTORY: Neuro deficit, acute, stroke suspected. Neuro deficit, gait problem. Reports symptoms started around 0730 with inability to focus and difficulty staying stable while ambulating. Denies weakness, facial droop slurred speech or loss of vision. FINDINGS: CTA NECK: AORTIC ARCH AND ARCH VESSELS: No dissection or arterial injury. No significant stenosis of the brachiocephalic or subclavian arteries. CERVICAL CAROTID ARTERIES: No dissection, arterial injury,  or hemodynamically significant stenosis by NASCET criteria. CERVICAL VERTEBRAL ARTERIES: No dissection, arterial injury, or significant stenosis. LUNGS AND MEDIASTINUM: Unremarkable. SOFT TISSUES: No acute abnormality. BONES: No acute abnormality. CTA HEAD: ANTERIOR CIRCULATION: Atherosclerotic calcifications of the ICA siphons without significant stenosis or aneurysm. POSTERIOR CIRCULATION: Persistent fetal origin of the left PCA. No significant stenosis of the posterior cerebral arteries. No significant stenosis of the basilar artery. No significant stenosis of the vertebral arteries. No aneurysm. OTHER: No dural venous sinus thrombosis on this non-dedicated study. IMPRESSION: 1. No large vessel  occlusion, hemodynamically significant stenosis, or aneurysm in the head or neck. 2. Atherosclerotic calcifications of the ICA siphons without significant stenosis or aneurysm. Electronically signed by: Ryan Chess MD 01/18/2024 04:57 PM EDT RP Workstation: [id number]   MR BRAIN WO CONTRAST Result Date: 01/18/2024 EXAM: MRI BRAIN WITHOUT CONTRAST 01/18/2024 02:43:47 PM TECHNIQUE: Multiplanar multisequence MRI of the head/brain was performed without the administration of intravenous contrast. COMPARISON: Head CT 01/18/2024, MRI brain 11/21/2021 CLINICAL HISTORY: Neuro deficit, acute, stroke suspected. Reports symptoms started around 0730 with inability to focus and difficulty staying stable while ambulating. Denies weakness, facial droop slurred speech or loss of vision. FINDINGS: BRAIN AND VENTRICLES: 2 foci of acute infarction in the right occipital lobe seen on images 22 and 27 of series 5. Old infarct in the inferior right parietal lobe. Background mild chronic small vessel disease. No significant mass effect or midline shift. Chronic micro hemorrhage in the right cerebellar hemisphere. ORBITS: No acute abnormality. SINUSES AND MASTOIDS: No acute abnormality. BONES AND SOFT TISSUES: Normal marrow signal. No acute soft tissue abnormality. IMPRESSION: 1. Acute infarction in the right occipital lobe. 2. Old infarct in the inferior right parietal lobe. 3. Mild chronic small vessel disease. Electronically signed by: Ryan Chess MD 01/18/2024 03:23 PM EDT RP Workstation: [id number]   CT HEAD WO CONTRAST Result Date: 01/18/2024 CLINICAL DATA:  Neuro deficit, acute, stroke suspected EXAM: CT HEAD WITHOUT CONTRAST TECHNIQUE: Contiguous axial images were obtained from the base of the skull through the vertex without intravenous contrast. RADIATION DOSE REDUCTION: This exam was performed according to the departmental dose-optimization program which includes automated exposure control, adjustment of the mA and/or  kV according to patient size and/or use of iterative reconstruction technique. COMPARISON:  CT head dated Nov 20, 2021. FINDINGS: Brain: There are chronic encephalomalacia changes again noted within the right parietal lobe. The brain is otherwise unremarkable. No evidence of hemorrhage, mass, acute cortical infarct or hydrocephalus. Vascular: Mild calcific atheromatous disease. Skull: Intact and unremarkable. Sinuses/Orbits: The visualized paranasal sinuses are clear. Status post bilateral lens replacement. Other: None. IMPRESSION: 1. 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 Electronically Signed   By: Evalene Coho M.D.   On: 01/18/2024 13:03    Microbiology: Results for orders placed or performed during the hospital encounter of 08/11/22  Culture, blood (Routine x 2)     Status: None   Collection Time: 08/11/22 11:16 AM   Specimen: BLOOD  Result Value Ref Range Status   Specimen Description BLOOD LEFT HAND  Final   Special Requests   Final    BOTTLES DRAWN AEROBIC AND ANAEROBIC Blood Culture results may not be optimal due to an inadequate volume of blood received in culture bottles   Culture   Final    NO GROWTH 6 DAYS Performed at [facility], [address], [place], [place] [address]    Report Status 08/17/2022 FINAL  Final  Culture, blood (Routine x 2)  Status: None   Collection Time: 08/11/22 11:21 AM   Specimen: BLOOD  Result Value Ref Range Status   Specimen Description BLOOD RIGHT ARM  Final   Special Requests   Final    BOTTLES DRAWN AEROBIC AND ANAEROBIC Blood Culture adequate volume   Culture   Final    NO GROWTH 6 DAYS Performed at [facility], [address], [place], [place] [address]    Report Status 08/17/2022 FINAL  Final  Resp panel by RT-PCR (RSV, Flu A&B, Covid) Anterior Nasal Swab     Status: None   Collection Time: 08/11/22 11:29 AM   Specimen: Anterior Nasal Swab  Result Value Ref Range Status   SARS Coronavirus 2 by RT PCR  NEGATIVE NEGATIVE Final    Comment: (NOTE) SARS-CoV-2 target nucleic acids are NOT DETECTED.  The SARS-CoV-2 RNA is generally detectable in upper respiratory specimens during the acute phase of infection. The lowest concentration of SARS-CoV-2 viral copies this assay can detect is 138 copies/mL. A negative result does not preclude SARS-Cov-2 infection and should not be used as the sole basis for treatment or other patient management decisions. A negative result may occur with  improper specimen collection/handling, submission of specimen other than nasopharyngeal swab, presence of viral mutation(s) within the areas targeted by this assay, and inadequate number of viral copies(<138 copies/mL). A negative result must be combined with clinical observations, patient history, and epidemiological information. The expected result is Negative.  Fact Sheet for Patients:  BloggerCourse.com  Fact Sheet for Healthcare Providers:  SeriousBroker.it  This test is no t yet approved or cleared by the [place]  FDA and  has been authorized for detection and/or diagnosis of SARS-CoV-2 by FDA under an Emergency Use Authorization (EUA). This EUA will remain  in effect (meaning this test can be used) for the duration of the COVID-19 declaration under Section 564(b)(1) of the Act, 21 U.S.C.section 360bbb-3(b)(1), unless the authorization is terminated  or revoked sooner.       Influenza A by PCR NEGATIVE NEGATIVE Final   Influenza B by PCR NEGATIVE NEGATIVE Final    Comment: (NOTE) The Xpert Xpress SARS-CoV-2/FLU/RSV plus assay is intended as an aid in the diagnosis of influenza from Nasopharyngeal swab specimens and should not be used as a sole basis for treatment. Nasal washings and aspirates are unacceptable for Xpert Xpress SARS-CoV-2/FLU/RSV testing.  Fact Sheet for Patients: BloggerCourse.com  Fact Sheet for  Healthcare Providers: SeriousBroker.it  This test is not yet approved or cleared by the [place]  FDA and has been authorized for detection and/or diagnosis of SARS-CoV-2 by FDA under an Emergency Use Authorization (EUA). This EUA will remain in effect (meaning this test can be used) for the duration of the COVID-19 declaration under Section 564(b)(1) of the Act, 21 U.S.C. section 360bbb-3(b)(1), unless the authorization is terminated or revoked.     Resp Syncytial Virus by PCR NEGATIVE NEGATIVE Final    Comment: (NOTE) Fact Sheet for Patients: BloggerCourse.com  Fact Sheet for Healthcare Providers: SeriousBroker.it  This test is not yet approved or cleared by the [place]  FDA and has been authorized for detection and/or diagnosis of SARS-CoV-2 by FDA under an Emergency Use Authorization (EUA). This EUA will remain in effect (meaning this test can be used) for the duration of the COVID-19 declaration under Section 564(b)(1) of the Act, 21 U.S.C. section 360bbb-3(b)(1), unless the authorization is terminated or revoked.  Performed at [facility], [address], [place], [place] [address]  Labs: CBC: Recent Labs  Lab 01/18/24 1253  WBC 7.4  NEUTROABS 4.6  HGB 13.9  HCT 40.6  MCV 95.3  PLT 207   Basic Metabolic Panel: Recent Labs  Lab 01/18/24 1253  NA 141  K 3.9  CL 102  CO2 29  GLUCOSE 93  BUN 20  CREATININE 0.75  CALCIUM  10.2   Liver Function Tests: Recent Labs  Lab 01/18/24 1253  AST 41  ALT 33  ALKPHOS 87  BILITOT 1.3*  PROT 7.9  ALBUMIN 4.5   CBG: No results for input(s): GLUCAP in the last 168 hours.  Discharge time spent: greater than 40 minutes.  Signed: Adriana DELENA Grams, MD [facility] 01/19/2024

## 2024-01-19 NOTE — Care Management Obs Status (Signed)
 MEDICARE OBSERVATION STATUS NOTIFICATION   Patient Details  Name: Beola Vasallo MRN: [id number] Date of Birth: February 12, 1946   Medicare Observation Status Notification Given:  Yes    Nena LITTIE Coffee, RN 01/19/2024, 2:45 PM

## 2024-01-19 NOTE — Care Management CC44 (Signed)
 Condition Code 44 Documentation Completed  Patient Details  Name: Misty Hughes MRN: [id number] Date of Birth: [birth year]   Condition Code 44 given:  Yes Patient signature on Condition Code 44 notice:  Yes Documentation of 2 MD's agreement:  Yes Code 44 added to claim:  Yes    Nena LITTIE Coffee, RN 01/19/2024, 2:45 PM

## 2024-01-19 NOTE — Evaluation (Signed)
 Physical Therapy Brief Evaluation and Discharge Note Patient Details Name: Misty Hughes MRN: [id number] DOB: 04-27-1946 Today's Date: 01/19/2024   History of Present Illness  a 78 y.o. female with medical history significant of hypertension, hyperlipidemia, history of right parietal stroke.  Patient comes in with blurred vision that started earlier today.  She came to the hospital for evaluation and was found to have a right occipital lobe stroke.  Since being here, her vision has improved a little bit.  She is able to see things little more clearly, but they are still blurred.  Denies fevers, chills, nausea, vomiting, arm and leg weakness, sensory changes, dysarthria.  Clinical Impression  Pt present sitting EOB with duaghter in room. Pt with continued blurred vision but no other changes. Patient is performing at functional baseline, no safety concerns noted throughout evaluation. No acute PT needs.  PT to sign off at this time. PT discharging pt to mobility technician and nursing staff for further mobility activities. Please reconsult acute PT services if functional changes occur.  Thank you.       PT Assessment    Assistance Needed at Discharge       Equipment Recommendations None recommended by PT  Recommendations for Other Services       Precautions/Restrictions Precautions Precautions: None Restrictions Weight Bearing Restrictions Per Provider Order: No        Mobility  Bed Mobility          Transfers Overall transfer level: Independent                      Ambulation/Gait Ambulation/Gait assistance: Independent Gait Distance (Feet): 75 Feet Assistive device: None Gait Pattern/deviations: WFL(Within Functional Limits), Decreased step length - left   General Gait Details: slow, decresaed speed and steplength due to R knee arthritis  Home Activity Instructions    Stairs            Modified Rankin (Stroke Patients Only)        Balance                           Pertinent Vitals/Pain   Pain Assessment Pain Assessment: No/denies pain     Home Living   Living Arrangements: Spouse/significant other       Home Equipment: None        Prior Function        UE/LE Assessment               Communication         Cognition         General Comments      Exercises     Assessment/Plan    PT Problem List         PT Visit Diagnosis Unsteadiness on feet (R26.81);Muscle weakness (generalized) (M62.81)    No Skilled PT Patient is independent with all acitivity/mobility   Co-evaluation                AMPAC 6 Clicks Help needed turning from your back to your side while in a flat bed without using bedrails?: None Help needed moving from lying on your back to sitting on the side of a flat bed without using bedrails?: None Help needed moving to and from a bed to a chair (including a wheelchair)?: None Help needed standing up from a chair using your arms (e.g., wheelchair or bedside chair)?: None Help needed to walk in hospital room?: None  Help needed climbing 3-5 steps with a railing? : None 6 Click Score: 24      End of Session Equipment Utilized During Treatment: Gait belt Activity Tolerance: Patient tolerated treatment well Patient left: in bed;with call bell/phone within reach;with family/visitor present Nurse Communication: Mobility status PT Visit Diagnosis: Unsteadiness on feet (R26.81);Muscle weakness (generalized) (M62.81)     Time: 9154-9142 PT Time Calculation (min) (ACUTE ONLY): 12 min  Charges:   PT Evaluation $PT Eval Low Complexity: 1 Low      Omega JONETTA Donna ALMETA, DPT [facility] [place] [phone] office  Omega JONETTA Donna  01/19/2024, 10:53 AM

## 2024-01-21 ENCOUNTER — Encounter: Payer: Self-pay | Admitting: Internal Medicine

## 2024-01-21 LAB — HEMOGLOBIN A1C
Hgb A1c MFr Bld: 5.1 % (ref 4.8–5.6)
Mean Plasma Glucose: 100 mg/dL

## 2024-01-21 NOTE — Telephone Encounter (Signed)
 Yes, set her up to be seen in the next several weeks    She should be on Eliquis   Stop Aspirin 

## 2024-01-21 NOTE — Progress Notes (Unsigned)
 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  The diplopia wouldn't be explained by the right occipital infarct raising possibility that she may have had a small MRI-negative brainstem stroke as well. Atrial fibrillation Hypertension Hyperlipidemia   Would continue Eliquis  despite breakthrough stroke.  Changing to an alternative anticoagulant has not been shown to be more beneficial Atorvastatin .  LDL goal less than 70 Normotensive blood pressure Hgb A1c goal less than 7 Continue routine exercise Mediterranean diet Follow up 6 months.   Subjective:  Misty Hughes is a 78 year old right-handed female with a fib, HTN, and HLD and history of right parietal stroke who presents for stroke.  History supplemented by hospital records.  On 01/18/2024, patient developed hazy vision.  Resolved when closing either eye.  No headache, facial droop or unilateral numbness and weakness.  Presented to [facility].  MRI of brain showed acute small right occipital ischemic infarct.  CTA of head and neck showed atherosclerotic calcifications in the ICA siphons but no LVO or high-grade stenosis.  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  LDL was 57 and Hgb A1c was 5.1.  She has atrial fibrillation and takes Eliquis .  Hasn't missed a dose.       PAST MEDICAL HISTORY: Past Medical History:  Diagnosis Date   Allergic rhinitis    Hyperlipidemia LDL goal <70    Hypertension    Stroke (HCC) 11/21/2021    PAST SURGICAL HISTORY: Past Surgical History:  Procedure Laterality Date   COLONOSCOPY WITH PROPOFOL  N/A 09/01/2022   Procedure: COLONOSCOPY WITH PROPOFOL ;  Surgeon:  Eartha Angelia Sieving, MD;  Location: [facility];  Service: Gastroenterology;  Laterality: N/A;  7:30am, asa 3   HERNIA REPAIR N/A    POLYPECTOMY  09/01/2022   Procedure: POLYPECTOMY;  Surgeon: Eartha Angelia, Sieving, MD;  Location: [facility];  Service: Gastroenterology;;    MEDICATIONS: Current Outpatient Medications on File Prior to Visit  Medication Sig Dispense Refill   albuterol (VENTOLIN HFA) 108 (90 Base) MCG/ACT inhaler Inhale 1-2 puffs into the lungs every 6 (six) hours as needed for wheezing or shortness of breath (asthma).     amLODipine (NORVASC) 2.5 MG tablet Take 2.5 mg by mouth daily.     atorvastatin  (LIPITOR) 40 MG tablet TAKE 1 TABLET(40 MG) BY MOUTH EVERY EVENING 90 tablet 1   Calcium  Carb-Cholecalciferol  (CALCIUM  + D3 PO) Take 1 tablet by mouth daily.     ELIQUIS  5 MG TABS tablet TAKE 1 TABLET(5 MG) BY MOUTH TWICE DAILY 180 tablet 1   famotidine  (PEPCID ) 20 MG tablet Take 1 tablet (20 mg total) by mouth 2 (two) times daily. 60 tablet 1   ferrous sulfate  325 (65 FE) MG tablet Take 325 mg by mouth daily with breakfast. OTC     fexofenadine  (ALLEGRA ) 180 MG tablet Take 1 tablet (180 mg total) by mouth daily as needed for allergies or rhinitis. (Patient taking differently: Take 180 mg by mouth daily.)     furosemide (LASIX) 20 MG tablet Take 10 mg by mouth daily.     metoprolol  tartrate (LOPRESSOR ) 25 MG tablet TAKE 1/2 TABLET(12.5 MG) BY MOUTH TWICE DAILY 90 tablet 3   Multiple Vitamin (MULTIVITAMIN WITH MINERALS) TABS tablet  Take 1 tablet by mouth daily.     Omega 3-6-9 CAPS Take 1 capsule by mouth daily.     polyethylene glycol (MIRALAX  / GLYCOLAX ) 17 g packet Take 17 g by mouth daily. 14 each 0   potassium chloride  SA (KLOR-CON  M20) 20 MEQ tablet Take 1 tablet (20 mEq total) by mouth daily. 90 tablet 3   [Paused] triamterene -hydrochlorothiazide (DYAZIDE) 37.5-25 MG capsule Take 1 capsule by mouth every morning.     No current facility-administered  medications on file prior to visit.    ALLERGIES: No Known Allergies  FAMILY HISTORY: Family History  Problem Relation Age of Onset   Hypertension Mother    Hypertension Sister    Sleep apnea Neg Hx     Objective:  Blood pressure 116/67, pulse 78, height 5' 1 (1.549 m), weight 136 lb (61.7 kg), SpO2 99%. General: No acute distress.  Patient appears well-groomed.   Head:  Normocephalic/atraumatic Eyes:  fundi examined but not visualized Neck: supple, no paraspinal tenderness, full range of motion Heart: regular rate and rhythm Vascular: No carotid bruits. Neurological Exam: Mental status: alert and oriented to person, place, and time, speech fluent and not dysarthric, language intact. Cranial nerves: CN I: not tested CN II: pupils equal, round and reactive to light, visual fields intact CN III, IV, VI:  full range of motion, no nystagmus, no ptosis CN V: facial sensation intact. CN VII: upper and lower face symmetric CN VIII: hearing intact CN IX, X: gag intact, uvula midline CN XI: sternocleidomastoid and trapezius muscles intact CN XII: tongue midline Bulk & Tone: normal, no fasciculations. Motor:  muscle strength 5/5 throughout Sensation:  Pinprick and vibratory sensation intact. Deep Tendon Reflexes:  2+ throughout,  toes downgoing.   Finger to nose testing:  Without dysmetria.   Gait:  Normal station and stride.  Romberg negative.    Thank you for allowing me to take part in the care of this patient.  Juliene Dunnings, DO

## 2024-01-21 NOTE — Telephone Encounter (Signed)
 Patient's daughter is calling to know if the patient should be seen before her annual visit due to having a mini stroke. Per daughter, Pt was told to stop her Asprin. Please advise.

## 2024-01-22 ENCOUNTER — Ambulatory Visit (INDEPENDENT_AMBULATORY_CARE_PROVIDER_SITE_OTHER): Admitting: Neurology

## 2024-01-22 ENCOUNTER — Other Ambulatory Visit: Payer: Self-pay | Admitting: Internal Medicine

## 2024-01-22 ENCOUNTER — Encounter: Payer: Self-pay | Admitting: Neurology

## 2024-01-22 VITALS — BP 116/67 | HR 78 | Ht 61.0 in | Wt 136.0 lb

## 2024-01-22 DIAGNOSIS — I4891 Unspecified atrial fibrillation: Secondary | ICD-10-CM

## 2024-01-22 DIAGNOSIS — I63431 Cerebral infarction due to embolism of right posterior cerebral artery: Secondary | ICD-10-CM | POA: Diagnosis not present

## 2024-01-22 DIAGNOSIS — E785 Hyperlipidemia, unspecified: Secondary | ICD-10-CM

## 2024-01-22 DIAGNOSIS — I10 Essential (primary) hypertension: Secondary | ICD-10-CM

## 2024-01-22 NOTE — Patient Instructions (Addendum)
 Continue Eliquis , atorvastatin , and blood pressure medications Mediterranean diet (see below) Continue exercise   Mediterranean Diet A Mediterranean diet is based on the traditions of countries on the [employer]. It focuses on eating more: Fruits and vegetables. Whole grains, beans, nuts, and seeds. Heart-healthy fats. These are fats that are good for your heart. It involves eating less: Dairy. Meat and eggs. Processed foods with added sugar, salt, and fat. This type of diet can help prevent certain conditions. It can also improve outcomes if you have a long-term (chronic) disease, such as kidney or heart disease. What are tips for following this plan? Reading food labels Check packaged foods for: The serving size. For foods such as rice and pasta, the serving size is the amount of cooked product, not dry. The total fat. Avoid foods with saturated fat or trans fat. Added sugars, such as corn syrup. Shopping  Try to have a balanced diet. Buy a variety of foods, such as: Fresh fruits and vegetables. You may be able to get these from local farmers markets. You can also buy them frozen. Grains, beans, nuts, and seeds. Some of these can be bought in bulk. Fresh seafood. Poultry and eggs. Low-fat dairy products. Buy whole ingredients instead of foods that have already been packaged. If you can't get fresh seafood, buy precooked frozen shrimp or canned fish, such as tuna, salmon, or sardines. Stock your pantry so you always have certain foods on hand, such as olive oil, canned tuna, canned tomatoes, rice, pasta, and beans. Cooking Cook foods with extra-virgin olive oil instead of using butter or other vegetable oils. Have meat as a side dish. Have vegetables or grains as your main dish. This means having meat in small portions or adding small amounts of meat to foods like pasta or stew. Use beans or vegetables instead of meat in common dishes like chili or lasagna. Try out  different cooking methods. Try roasting, broiling, steaming, and sauting vegetables. Add frozen vegetables to soups, stews, pasta, or rice. Add nuts or seeds for added healthy fats and plant protein at each meal. You can add these to yogurt, salads, or vegetable dishes. Marinate fish or vegetables using olive oil, lemon juice, garlic, and fresh herbs. Meal planning Plan to eat a vegetarian meal one day each week. Try to work up to two vegetarian meals, if possible. Eat seafood two or more times a week. Have healthy snacks on hand. These may include: Vegetable sticks with hummus. Greek yogurt. Fruit and nut trail mix. Eat balanced meals. These should include: Fruit: 2-3 servings a day. Vegetables: 4-5 servings a day. Low-fat dairy: 2 servings a day. Fish, poultry, or lean meat: 1 serving a day. Beans and legumes: 2 or more servings a week. Nuts and seeds: 1-2 servings a day. Whole grains: 6-8 servings a day. Extra-virgin olive oil: 3-4 servings a day. Limit red meat and sweets to just a few servings a month. Lifestyle  Try to cook and eat meals with your family. Drink enough fluid to keep your pee (urine) pale yellow. Be active every day. This includes: Aerobic exercise, which is exercise that causes your heart to beat faster. Examples include running and swimming. Leisure activities like gardening, walking, or housework. Get 7-8 hours of sleep each night. Drink red wine if your provider says you can. A glass of wine is 5 oz (150 mL). You may be allowed to have: Up to 1 glass a day if you're female and not pregnant. Up to 2  glasses a day if you're female. What foods should I eat? Fruits Apples. Apricots. Avocado. Berries. Bananas. Cherries. Dates. Figs. Grapes. Lemons. Melon. Oranges. Peaches. Plums. Pomegranate. Vegetables Artichokes. Beets. Broccoli. Cabbage. Carrots. Eggplant. Green beans. Chard. Kale. Spinach. Onions. Leeks. Peas. Squash. Tomatoes. Peppers.  Radishes. Grains Whole-grain pasta. Brown rice. Bulgur wheat. Polenta. Couscous. Whole-wheat bread. [place] Madeira. Meats and other proteins Beans. Almonds. Sunflower seeds. Pine nuts. Peanuts. Cod. Salmon. Scallops. Shrimp. Tuna. Tilapia. Clams. Oysters. Eggs. Chicken or [place] without skin. Dairy Low-fat milk. Cheese. Greek yogurt. Fats and oils Extra-virgin olive oil. Avocado oil. Grapeseed oil. Beverages Water. Red wine. Herbal tea. Sweets and desserts Greek yogurt with honey. Baked apples. Poached pears. Trail mix. Seasonings and condiments Basil. Cilantro. Coriander. Cumin. Mint. Parsley. Sage. Rosemary. Tarragon. Garlic. Oregano. Thyme. Pepper. Balsamic vinegar. Tahini. Hummus. Tomato sauce. Olives. Mushrooms. The items listed above may not be all the foods and drinks you can have. Talk to a dietitian to learn more. What foods should I limit? This is a list of foods that should be eaten rarely. Fruits Fruit canned in syrup. Vegetables Deep-fried potatoes, like [place] fries. Grains Packaged pasta or rice dishes. Cereal with added sugar. Snacks with added sugar. Meats and other proteins Beef. Pork. Lamb. Chicken or [place] with skin. Hot dogs. [place]. Dairy Ice cream. Sour cream. Whole milk. Fats and oils Butter. Canola oil. Vegetable oil. Beef fat (tallow). Lard. Beverages Juice. Sugar-sweetened soft drinks. Beer. Liquor and spirits. Sweets and desserts Cookies. Cakes. Pies. Candy. Seasonings and condiments Mayonnaise. Pre-made sauces and marinades. The items listed above may not be all the foods and drinks you should limit. Talk to a dietitian to learn more. Where to find more information American Heart Association (AHA): heart.org This information is not intended to replace advice given to you by your health care provider. Make sure you discuss any questions you have with your health care provider. Document Revised: 10/08/2022 Document Reviewed: 10/08/2022 Elsevier  Patient Education  2024 [employer].

## 2024-02-01 ENCOUNTER — Ambulatory Visit: Attending: Nurse Practitioner | Admitting: Nurse Practitioner

## 2024-02-01 ENCOUNTER — Encounter: Payer: Self-pay | Admitting: Nurse Practitioner

## 2024-02-01 VITALS — BP 108/64 | HR 75 | Ht 61.0 in | Wt 134.4 lb

## 2024-02-01 DIAGNOSIS — I272 Pulmonary hypertension, unspecified: Secondary | ICD-10-CM

## 2024-02-01 DIAGNOSIS — I639 Cerebral infarction, unspecified: Secondary | ICD-10-CM | POA: Diagnosis not present

## 2024-02-01 DIAGNOSIS — I38 Endocarditis, valve unspecified: Secondary | ICD-10-CM

## 2024-02-01 DIAGNOSIS — E785 Hyperlipidemia, unspecified: Secondary | ICD-10-CM

## 2024-02-01 DIAGNOSIS — I10 Essential (primary) hypertension: Secondary | ICD-10-CM

## 2024-02-01 DIAGNOSIS — I480 Paroxysmal atrial fibrillation: Secondary | ICD-10-CM

## 2024-02-01 NOTE — Patient Instructions (Addendum)
 Medication Instructions:  Your physician recommends that you continue on your current medications as directed. Please refer to the Current Medication list given to you today.  Labwork: None   Testing/Procedures: None   Follow-Up: Your physician recommends that you schedule a follow-up appointment in: 4-6 weeks   Any Other Special Instructions Will Be Listed Below (If Applicable).  If you need a refill on your cardiac medications before your next appointment, please call your pharmacy.

## 2024-02-01 NOTE — H&P (View-Only) (Signed)
 Cardiology Office Note   Date:  02/01/2024  ID:  Misty Hughes, DOB Nov 08, 1945, MRN [id number] PCP: Pcp, No  [facility] Providers Cardiologist:  Vina Gull, MD     History of Present Illness Misty Hughes is a 78 y.o. female with a PMH of CVA, A-fib, valvular heart disease, HTN, and HLD, who presents today for scheduled follow-up.   Last seen by Dr. Gull on September 04, 2023. Was overall doing well at the time.   Recently hospitalized d/t occiptal stroke. Came in with stroke like symptoms. Vision improved slightly. See MRI/MRA of head and CTA of head and neck as outlined below. Echo revealed normal LVEF, see full outline below. No atrial shunt detected by doppler.   Today she presents for hospital follow-up. She states she is doing well.  She is compliant with all of her medications and tolerating this well.  Shows me her heart rate and blood pressure log that show excellent readings.  Tells me she was very active and exercise regularly prior to her stroke but has not been exercising since her stroke and wants to make sure she does this correctly.  Doing well from a cardiac standpoint. Denies any chest pain, shortness of breath, palpitations, syncope, presyncope, dizziness, orthopnea, PND, swelling or significant weight changes, acute bleeding, or claudication.  Tells me she had sleep study performed earlier this year that was negative.   ROS: Negative.  See HPI.  Studies Reviewed  EKG:  EKG Interpretation Date/Time:  Friday February 01 2024 09:59:33 EDT Ventricular Rate:  68 PR Interval:    QRS Duration:  72 QT Interval:  384 QTC Calculation: 408 R Axis:   84  Text Interpretation: Accelerated Junctional rhythm When compared with ECG of 13-Dec-2022 21:55, PREVIOUS ECG IS PRESENT Confirmed by Miriam Norris [phone]) on 02/01/2024 10:07:15 AM   TTE 01/2024:  1. Left ventricular ejection fraction, by estimation, is 60 to 65%. The  left ventricle has normal function. The left  ventricle has no regional  wall motion abnormalities. There is mild left ventricular hypertrophy.  Left ventricular diastolic parameters  are indeterminate.   2. Right ventricular systolic function is mildly reduced. The right  ventricular size is mildly enlarged. There is moderately elevated  pulmonary artery systolic pressure. The estimated right ventricular  systolic pressure is 45.2 mmHg.   3. Left atrial size was severely dilated.   4. Right atrial size was severely dilated.   5. The mitral valve is degenerative. Trivial mitral valve regurgitation.  No evidence of mitral stenosis. Moderate mitral annular calcification.   6. 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   7. The aortic valve is grossly normal. Aortic valve regurgitation is not  visualized. No aortic stenosis is present.   8. The inferior vena cava is normal in size with greater than 50%  respiratory variability, suggesting right atrial pressure of 3 mmHg.   Conclusion(s)/Recommendation(s): No intracardiac source of embolism  detected on this transthoracic study. Consider a transesophageal  echocardiogram to exclude cardiac source of embolism if clinically  indicated.   Cardiac monitor 01/2022:  Monitor shows atrial fibrillation and atrial flutter, rapid at times. Burden was close to 70% of the monitoring time. She is already on anticoagulation.  Physical Exam VS:  BP 108/64   Pulse 75   Ht 5' 1 (1.549 m)   Wt 134 lb 6.4 oz (61 kg)   SpO2 97%   BMI 25.39 kg/m   Wt Readings from Last  3 Encounters:  02/01/24 134 lb 6.4 oz (61 kg)  01/22/24 136 lb (61.7 kg)  01/18/24 130 lb 11.7 oz (59.3 kg)    GEN: Well nourished, well developed in no acute distress NECK: No JVD; No carotid bruits CARDIAC: S1/S2, RRR, no murmurs, rubs, gallops RESPIRATORY:  Clear to auscultation without rales, wheezing or rhonchi  ABDOMEN: Soft, non-tender, non-distended EXTREMITIES:  No edema; No  deformity   ASSESSMENT AND PLAN  Recurrent CVA's Etiology unclear. Most recent Echo showed no intracardiac source of embolism detected on TTE was was recommended to consider TEE to exclude cardiac source of embolism if clinically indicated. She is compliant with Eliquis . Will run this past attending cardiologist (as recommended by DOD today, Dr. Wilbert Bihari).  If Dr. Okey agrees with TEE, I have already gone over risks versus benefits of this procedure as well as contraindications-see below.  She denies any of the following of relative/absolute contraindications and went over risk versus benefits of this and she verbalized understanding is agreeable to proceed if attending cardiologist is agreeable with this.  Continue current medication regimen. Heart healthy diet and regular cardiovascular exercise encouraged. Will also consult about outpatient PT/OT since her stroke.  Consulted patient's attending cardiologist as recommended by DOD who agreed to arrange TEE due to source of strokes that appear to be embolic.  Orders are under signed and held.    Informed Consent   Shared Decision Making/Informed Consent   The risks [esophageal damage, perforation (1:10,000 risk), bleeding, pharyngeal hematoma as well as other potential complications associated with conscious sedation including aspiration, arrhythmia, respiratory failure and death], benefits (treatment guidance and diagnostic support) and alternatives of a transesophageal echocardiogram were discussed in detail with [name] and she is willing to proceed.   2. Paroxysmal A-fib Denies any tachycardia or palpitations.  She is not in A-fib today.  Heart rate is well-controlled.  Continue current medication regimen.  Continue Eliquis  for stroke prevention.  She is on appropriate dosage denies any bleeding issues.  2. Valvular heart disease, pulmonary HTN Echocardiogram from January 19, 2024 showed mildly reduced right ventricular systolic function  with moderately elevated PASP, estimated right ventricular systolic pressure was found to be 45.2 mmHg.  PASP stable since 2023.  Etiology unclear. Tells me she was tested for sleep apnea earlier this year and was found to be negative.  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  Denies any symptoms regarding this. Will continue to follow and consult attending cardiologist.   3. HTN Blood pressure stable and well-controlled. Discussed to monitor BP at home at least 2 hours after medications and sitting for 5-10 minutes.  No medication changes at this time. Heart healthy diet and regular cardiovascular exercise encouraged.   4. HLD Most recent LDL 57 from 13 days ago.  She is currently at goal.  Continue atorvastatin . Heart healthy diet and regular cardiovascular exercise encouraged.      Dispo: Follow-up with MD/APP in 4 to 6 weeks or sooner if any changes.  Signed, Almarie Crate, NP

## 2024-02-01 NOTE — Progress Notes (Addendum)
 Cardiology Office Note   Date:  02/01/2024  ID:  Misty Hughes, DOB Nov 08, 1945, MRN [id number] PCP: Pcp, No  [facility] Providers Cardiologist:  Vina Gull, MD     History of Present Illness Misty Hughes is a 78 y.o. female with a PMH of CVA, A-fib, valvular heart disease, HTN, and HLD, who presents today for scheduled follow-up.   Last seen by Dr. Gull on September 04, 2023. Was overall doing well at the time.   Recently hospitalized d/t occiptal stroke. Came in with stroke like symptoms. Vision improved slightly. See MRI/MRA of head and CTA of head and neck as outlined below. Echo revealed normal LVEF, see full outline below. No atrial shunt detected by doppler.   Today she presents for hospital follow-up. She states she is doing well.  She is compliant with all of her medications and tolerating this well.  Shows me her heart rate and blood pressure log that show excellent readings.  Tells me she was very active and exercise regularly prior to her stroke but has not been exercising since her stroke and wants to make sure she does this correctly.  Doing well from a cardiac standpoint. Denies any chest pain, shortness of breath, palpitations, syncope, presyncope, dizziness, orthopnea, PND, swelling or significant weight changes, acute bleeding, or claudication.  Tells me she had sleep study performed earlier this year that was negative.   ROS: Negative.  See HPI.  Studies Reviewed  EKG:  EKG Interpretation Date/Time:  Friday February 01 2024 09:59:33 EDT Ventricular Rate:  68 PR Interval:    QRS Duration:  72 QT Interval:  384 QTC Calculation: 408 R Axis:   84  Text Interpretation: Accelerated Junctional rhythm When compared with ECG of 13-Dec-2022 21:55, PREVIOUS ECG IS PRESENT Confirmed by Miriam Norris [phone]) on 02/01/2024 10:07:15 AM   TTE 01/2024:  1. Left ventricular ejection fraction, by estimation, is 60 to 65%. The  left ventricle has normal function. The left  ventricle has no regional  wall motion abnormalities. There is mild left ventricular hypertrophy.  Left ventricular diastolic parameters  are indeterminate.   2. Right ventricular systolic function is mildly reduced. The right  ventricular size is mildly enlarged. There is moderately elevated  pulmonary artery systolic pressure. The estimated right ventricular  systolic pressure is 45.2 mmHg.   3. Left atrial size was severely dilated.   4. Right atrial size was severely dilated.   5. The mitral valve is degenerative. Trivial mitral valve regurgitation.  No evidence of mitral stenosis. Moderate mitral annular calcification.   6. 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   7. The aortic valve is grossly normal. Aortic valve regurgitation is not  visualized. No aortic stenosis is present.   8. The inferior vena cava is normal in size with greater than 50%  respiratory variability, suggesting right atrial pressure of 3 mmHg.   Conclusion(s)/Recommendation(s): No intracardiac source of embolism  detected on this transthoracic study. Consider a transesophageal  echocardiogram to exclude cardiac source of embolism if clinically  indicated.   Cardiac monitor 01/2022:  Monitor shows atrial fibrillation and atrial flutter, rapid at times. Burden was close to 70% of the monitoring time. She is already on anticoagulation.  Physical Exam VS:  BP 108/64   Pulse 75   Ht 5' 1 (1.549 m)   Wt 134 lb 6.4 oz (61 kg)   SpO2 97%   BMI 25.39 kg/m   Wt Readings from Last  3 Encounters:  02/01/24 134 lb 6.4 oz (61 kg)  01/22/24 136 lb (61.7 kg)  01/18/24 130 lb 11.7 oz (59.3 kg)    GEN: Well nourished, well developed in no acute distress NECK: No JVD; No carotid bruits CARDIAC: S1/S2, RRR, no murmurs, rubs, gallops RESPIRATORY:  Clear to auscultation without rales, wheezing or rhonchi  ABDOMEN: Soft, non-tender, non-distended EXTREMITIES:  No edema; No  deformity   ASSESSMENT AND PLAN  Recurrent CVA's Etiology unclear. Most recent Echo showed no intracardiac source of embolism detected on TTE was was recommended to consider TEE to exclude cardiac source of embolism if clinically indicated. She is compliant with Eliquis . Will run this past attending cardiologist (as recommended by DOD today, Dr. Wilbert Bihari).  If Dr. Okey agrees with TEE, I have already gone over risks versus benefits of this procedure as well as contraindications-see below.  She denies any of the following of relative/absolute contraindications and went over risk versus benefits of this and she verbalized understanding is agreeable to proceed if attending cardiologist is agreeable with this.  Continue current medication regimen. Heart healthy diet and regular cardiovascular exercise encouraged. Will also consult about outpatient PT/OT since her stroke.  Consulted patient's attending cardiologist as recommended by DOD who agreed to arrange TEE due to source of strokes that appear to be embolic.  Orders are under signed and held.    Informed Consent   Shared Decision Making/Informed Consent   The risks [esophageal damage, perforation (1:10,000 risk), bleeding, pharyngeal hematoma as well as other potential complications associated with conscious sedation including aspiration, arrhythmia, respiratory failure and death], benefits (treatment guidance and diagnostic support) and alternatives of a transesophageal echocardiogram were discussed in detail with [name] and she is willing to proceed.   2. Paroxysmal A-fib Denies any tachycardia or palpitations.  She is not in A-fib today.  Heart rate is well-controlled.  Continue current medication regimen.  Continue Eliquis  for stroke prevention.  She is on appropriate dosage denies any bleeding issues.  2. Valvular heart disease, pulmonary HTN Echocardiogram from January 19, 2024 showed mildly reduced right ventricular systolic function  with moderately elevated PASP, estimated right ventricular systolic pressure was found to be 45.2 mmHg.  PASP stable since 2023.  Etiology unclear. Tells me she was tested for sleep apnea earlier this year and was found to be negative.  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  Denies any symptoms regarding this. Will continue to follow and consult attending cardiologist.   3. HTN Blood pressure stable and well-controlled. Discussed to monitor BP at home at least 2 hours after medications and sitting for 5-10 minutes.  No medication changes at this time. Heart healthy diet and regular cardiovascular exercise encouraged.   4. HLD Most recent LDL 57 from 13 days ago.  She is currently at goal.  Continue atorvastatin . Heart healthy diet and regular cardiovascular exercise encouraged.      Dispo: Follow-up with MD/APP in 4 to 6 weeks or sooner if any changes.  Signed, Almarie Crate, NP

## 2024-02-05 ENCOUNTER — Encounter: Payer: Self-pay | Admitting: Nurse Practitioner

## 2024-02-05 ENCOUNTER — Telehealth: Payer: Self-pay | Admitting: Nurse Practitioner

## 2024-02-05 NOTE — Telephone Encounter (Signed)
 Checking percert on the following patient for testing scheduled at [facility]    TEE ONLY on 8/1 @1 :45 PM with Dr.Mallipeddi

## 2024-02-05 NOTE — Telephone Encounter (Signed)
 Patient informed and verbalized understanding of plan. Patient is scheduled for a TEE ONLY on 8/1 @1 :45 PM with Dr.Mallipeddi and PAT is 7/31 @ 11:30

## 2024-02-05 NOTE — Telephone Encounter (Signed)
-----   Message from Almarie Crate sent at 02/04/2024  4:13 PM EDT ----- Just heard back from Dr. Okey. She said okay to schedule TEE.   Thanks!   Best, Almarie Crate, NP ----- Message ----- From: [name], MD Sent: 02/04/2024   4:12 PM EDT To: Almarie Crate, NP  Agree sched TEE given CVAs felt to be embolic  and pt on NOAC ----- Message ----- From: Crate Almarie, NP Sent: 02/01/2024  11:11 AM EDT To: Vina Okey GAILS, MD  Patient recently hospitalized with a stroke.  Echocardiogram negative for any kind of shunting.  Did recommend TEE for further evaluation.  Wanted to see if you are okay with arranging TEE for her.  Also, known history of pulmonary hypertension.  Previous sleep study earlier this year was reassuring.  Wanted to see if he wanted further workup-highly doubt this is PE as she is on Eliquis  and denied any symptoms of PE.  Do recommend considering PFTs at follow-up? Appreciate your input.   Thanks!   Best, Almarie Crate, NP

## 2024-02-05 NOTE — Addendum Note (Signed)
 Addended by: MIRIAM NORRIS on: 02/05/2024 09:14 AM   Modules accepted: Orders

## 2024-02-06 ENCOUNTER — Encounter (HOSPITAL_COMMUNITY): Payer: Self-pay

## 2024-02-06 ENCOUNTER — Inpatient Hospital Stay (HOSPITAL_COMMUNITY)
Admission: RE | Admit: 2024-02-06 | Discharge: 2024-02-06 | Disposition: A | Discharge status: Home / Self Care | Source: Ambulatory Visit

## 2024-02-06 HISTORY — DX: Unspecified asthma, uncomplicated: J45.909

## 2024-02-06 NOTE — Telephone Encounter (Signed)
 Spoke with Misty Hughes from [facility] and patients TEE needed to be rescheduled due to availability in August. Needed to be moved to 07/31. Called patient and she stated that she was ok with moving to Thursday and will be notified regarding her pre-op later in the day. Patient verbalized understanding and provider has been made aware as well.

## 2024-02-07 ENCOUNTER — Encounter (HOSPITAL_COMMUNITY): Payer: Self-pay | Admitting: Internal Medicine

## 2024-02-07 ENCOUNTER — Encounter (HOSPITAL_COMMUNITY)
Admission: RE | Disposition: A | Discharge status: Home / Self Care | Payer: Self-pay | Source: Home / Self Care | Attending: Internal Medicine

## 2024-02-07 ENCOUNTER — Ambulatory Visit (HOSPITAL_BASED_OUTPATIENT_CLINIC_OR_DEPARTMENT_OTHER): Payer: Self-pay | Admitting: Certified Registered"

## 2024-02-07 ENCOUNTER — Ambulatory Visit (HOSPITAL_COMMUNITY)
Admission: RE | Admit: 2024-02-07 | Discharge: 2024-02-07 | Disposition: A | Discharge status: Home / Self Care | Attending: Internal Medicine | Admitting: Internal Medicine

## 2024-02-07 ENCOUNTER — Inpatient Hospital Stay (HOSPITAL_COMMUNITY): Admission: RE | Admit: 2024-02-07 | Source: Ambulatory Visit

## 2024-02-07 ENCOUNTER — Ambulatory Visit (HOSPITAL_COMMUNITY): Payer: Self-pay | Admitting: Certified Registered"

## 2024-02-07 ENCOUNTER — Other Ambulatory Visit: Payer: Self-pay

## 2024-02-07 ENCOUNTER — Ambulatory Visit (HOSPITAL_COMMUNITY)

## 2024-02-07 DIAGNOSIS — Z79899 Other long term (current) drug therapy: Secondary | ICD-10-CM | POA: Diagnosis not present

## 2024-02-07 DIAGNOSIS — E785 Hyperlipidemia, unspecified: Secondary | ICD-10-CM | POA: Diagnosis not present

## 2024-02-07 DIAGNOSIS — I361 Nonrheumatic tricuspid (valve) insufficiency: Secondary | ICD-10-CM

## 2024-02-07 DIAGNOSIS — I272 Pulmonary hypertension, unspecified: Secondary | ICD-10-CM | POA: Insufficient documentation

## 2024-02-07 DIAGNOSIS — I10 Essential (primary) hypertension: Secondary | ICD-10-CM | POA: Diagnosis present

## 2024-02-07 DIAGNOSIS — Z7901 Long term (current) use of anticoagulants: Secondary | ICD-10-CM | POA: Insufficient documentation

## 2024-02-07 DIAGNOSIS — I639 Cerebral infarction, unspecified: Secondary | ICD-10-CM | POA: Diagnosis present

## 2024-02-07 DIAGNOSIS — J45909 Unspecified asthma, uncomplicated: Secondary | ICD-10-CM

## 2024-02-07 DIAGNOSIS — I480 Paroxysmal atrial fibrillation: Secondary | ICD-10-CM | POA: Diagnosis not present

## 2024-02-07 DIAGNOSIS — Z8673 Personal history of transient ischemic attack (TIA), and cerebral infarction without residual deficits: Secondary | ICD-10-CM | POA: Diagnosis not present

## 2024-02-07 LAB — ECHO TEE

## 2024-02-07 SURGERY — ECHOCARDIOGRAM, TRANSESOPHAGEAL
Anesthesia: General

## 2024-02-07 MED ORDER — SODIUM CHLORIDE 0.9 % IV SOLN: INTRAVENOUS | Status: DC

## 2024-02-07 MED ORDER — PHENYLEPHRINE 80 MCG/ML (10ML) SYRINGE FOR IV PUSH (FOR BLOOD PRESSURE SUPPORT)
PREFILLED_SYRINGE | INTRAVENOUS | Status: AC
  Filled 2024-02-07: qty 10

## 2024-02-07 MED ORDER — PROPOFOL 10 MG/ML IV BOLUS
INTRAVENOUS | Status: AC
Start: 2024-02-07 — End: 2024-02-07
  Filled 2024-02-07: qty 20

## 2024-02-07 MED ORDER — BUTAMBEN-TETRACAINE-BENZOCAINE 2-2-14 % EX AERO
1.0000 | INHALATION_SPRAY | Freq: Once | CUTANEOUS | Status: AC
  Administered 2024-02-07: 1 via TOPICAL

## 2024-02-07 MED ORDER — DEXMEDETOMIDINE HCL IN NACL 80 MCG/20ML IV SOLN
INTRAVENOUS | Status: DC | PRN
  Administered 2024-02-07: 2 ug via INTRAVENOUS

## 2024-02-07 MED ORDER — BUTAMBEN-TETRACAINE-BENZOCAINE 2-2-14 % EX AERO
INHALATION_SPRAY | CUTANEOUS | Status: AC
  Filled 2024-02-07: qty 5

## 2024-02-07 MED ORDER — LIDOCAINE 2% (20 MG/ML) 5 ML SYRINGE
INTRAMUSCULAR | Status: AC
  Filled 2024-02-07: qty 5

## 2024-02-07 MED ORDER — PROPOFOL 500 MG/50ML IV EMUL
INTRAVENOUS | Status: AC
  Filled 2024-02-07: qty 50

## 2024-02-07 MED ORDER — DEXMEDETOMIDINE HCL IN NACL 80 MCG/20ML IV SOLN
INTRAVENOUS | Status: AC
  Filled 2024-02-07: qty 20

## 2024-02-07 MED ORDER — LACTATED RINGERS IV SOLN: INTRAVENOUS | Status: DC | PRN

## 2024-02-07 MED ORDER — PERFLUTREN LIPID MICROSPHERE: 1.0000 mL | INTRAVENOUS | Status: DC | PRN

## 2024-02-07 MED ORDER — PROPOFOL 10 MG/ML IV BOLUS
INTRAVENOUS | Status: DC | PRN
  Administered 2024-02-07: 70 mg via INTRAVENOUS
  Administered 2024-02-07: 125 ug/kg/min via INTRAVENOUS

## 2024-02-07 MED ORDER — LIDOCAINE 2% (20 MG/ML) 5 ML SYRINGE
INTRAMUSCULAR | Status: DC | PRN
  Administered 2024-02-07: 40 mg via INTRAVENOUS

## 2024-02-07 NOTE — Transfer of Care (Signed)
 Immediate Anesthesia Transfer of Care Note  Patient: Misty Hughes  Procedure(s) Performed: ECHOCARDIOGRAM, TRANSESOPHAGEAL  Patient Location: PACU  Anesthesia Type:MAC  Level of Consciousness: awake and alert   Airway & Oxygen Therapy: Patient Spontanous Breathing and Patient connected to nasal cannula oxygen  Post-op Assessment: Report given to RN and Post -op Vital signs reviewed and stable  Post vital signs: Reviewed and stable  Last Vitals:  Vitals Value Taken Time  BP    Temp    Pulse    Resp    SpO2      Last Pain:  Vitals:   02/07/24 1113  TempSrc: Oral  PainSc: 0-No pain      Patients Stated Pain Goal: 6 (02/07/24 1113)  Complications: No notable events documented.

## 2024-02-07 NOTE — Progress Notes (Signed)
  Echocardiogram 2D Echocardiogram has been performed.  Misty Hughes 02/07/2024, 3:02 PM

## 2024-02-07 NOTE — Anesthesia Procedure Notes (Addendum)
 Date/Time: 02/07/2024 1:14 PM  Performed by: Frans Valente, Belinda L, CRNAOxygen Delivery Method: Nasal cannula

## 2024-02-07 NOTE — CV Procedure (Signed)
   TRANSESOPHAGEAL ECHOCARDIOGRAM  NAME:  Misty Hughes    MRN: [id number] DOB:  [birth year]    ADMIT DATE: 02/07/2024  INDICATIONS: Recurrent strokes, r/o LAA thrombus  PROCEDURE:  Informed consent was obtained prior to the procedure. After a procedural time-out, the oropharynx was anesthetized and the patient was sedated by the anesthesia service. The transesophageal probe was inserted in the esophagus and stomach without difficulty and multiple views were obtained. Anesthesia was monitored by Dr. Kendell.  There were no apparent complications. The patient had normal neuro status and respiratory status post procedure with vitals stable as recorded elsewhere. Adequate airway was maintained throughout and vital signs monitored per protocol.   KEY FINDINGS: Normal LVEF, severe RA dilatation, moderate to severe TR and moderate pulmonary HTN. Low LAA emptying velocities, 20 cm/sec. Swirling smoke like pattern seen in left atrium and left atrial appendage. Definity  is used to assess for any filling defects in the LAA. No obvious major filling defects noted however very small filling defect cannot be excluded due to slow filling of LAA and inability to adequately visualize the entire LAA with Definity .   [name], MD [place]  [facility]  1:59 PM

## 2024-02-07 NOTE — Anesthesia Preprocedure Evaluation (Signed)
 Anesthesia Evaluation  Patient identified by MRN, date of birth, ID band Patient awake    Reviewed: Allergy & Precautions, H&P , NPO status , Patient's Chart, lab work & pertinent test results, reviewed documented beta blocker date and time   Airway Mallampati: II  TM Distance: >3 FB Neck ROM: full    Dental no notable dental hx.    Pulmonary asthma    Pulmonary exam normal breath sounds clear to auscultation       Cardiovascular Exercise Tolerance: Good hypertension,  Rhythm:regular Rate:Normal     Neuro/Psych CVA  negative psych ROS   GI/Hepatic negative GI ROS, Neg liver ROS,,,  Endo/Other  negative endocrine ROS    Renal/GU negative Renal ROS  negative genitourinary   Musculoskeletal   Abdominal   Peds  Hematology negative hematology ROS (+)   Anesthesia Other Findings   Reproductive/Obstetrics negative OB ROS                              Anesthesia Physical Anesthesia Plan  ASA: 3  Anesthesia Plan: General   Post-op Pain Management:    Induction:   PONV Risk Score and Plan: Propofol  infusion  Airway Management Planned:   Additional Equipment:   Intra-op Plan:   Post-operative Plan:   Informed Consent: I have reviewed the patients History and Physical, chart, labs and discussed the procedure including the risks, benefits and alternatives for the proposed anesthesia with the patient or authorized representative who has indicated his/her understanding and acceptance.     Dental Advisory Given  Plan Discussed with: CRNA  Anesthesia Plan Comments:         Anesthesia Quick Evaluation

## 2024-02-07 NOTE — Interval H&P Note (Signed)
 History and Physical Interval Note:  02/07/2024 1:58 PM  Misty Hughes  has presented today for surgery, with the diagnosis of recurrent strokes.  The various methods of treatment have been discussed with the patient and family. After consideration of risks, benefits and other options for treatment, the patient has consented to  Procedure(s): ECHOCARDIOGRAM, TRANSESOPHAGEAL (N/A) as a surgical intervention.  The patient's history has been reviewed, patient examined, no change in status, stable for surgery.  I have reviewed the patient's chart and labs.  Questions were answered to the patient's satisfaction.     Clerance Umland P Lachlan Mckim, MD [facility]

## 2024-02-08 NOTE — Anesthesia Postprocedure Evaluation (Signed)
 Anesthesia Post Note  Patient: Misty Hughes  Procedure(s) Performed: ECHOCARDIOGRAM, TRANSESOPHAGEAL  Patient location during evaluation: Phase II Anesthesia Type: General Level of consciousness: awake Pain management: pain level controlled Vital Signs Assessment: post-procedure vital signs reviewed and stable Respiratory status: spontaneous breathing and respiratory function stable Cardiovascular status: blood pressure returned to baseline and stable Postop Assessment: no headache and no apparent nausea or vomiting Anesthetic complications: no Comments: Late entry   No notable events documented.   Last Vitals:  Vitals:   02/07/24 1424 02/07/24 1437  BP: (!) 150/83   Pulse: 64 67  Resp: 14 15  Temp:  36.4 C  SpO2: 99% 100%    Last Pain:  Vitals:   02/08/24 1326  TempSrc:   PainSc: 0-No pain                 Misty Hughes

## 2024-02-09 ENCOUNTER — Encounter (HOSPITAL_COMMUNITY): Payer: Self-pay | Admitting: Internal Medicine

## 2024-02-15 ENCOUNTER — Encounter: Payer: Self-pay | Admitting: Internal Medicine

## 2024-02-20 ENCOUNTER — Telehealth: Payer: Self-pay | Admitting: Internal Medicine

## 2024-02-20 NOTE — Telephone Encounter (Signed)
 Returned call to [place] pt's daughter. Informed daughter msg sent to provider. Daughter requesting a call from provider.

## 2024-02-20 NOTE — Telephone Encounter (Signed)
 Daughter is calling in because no one responded to her mychart message on 8/8. She is expecting a call back today. Please advise

## 2024-02-20 NOTE — Telephone Encounter (Signed)
 Dr.[name] was messaged on 02/15/24, I have sent a secure chat to Dr.[name] now.

## 2024-02-21 IMAGING — MR MR HEAD W/O CM
11 of 12 series · 42 of 48 positions shown · non-contrast
Comparison: None Available.

CLINICAL DATA: Neuro deficit, acute, stroke suspected; speech
difficulty and right hand weakness, mostly resolved.

EXAM:
MRI HEAD WITHOUT CONTRAST
TECHNIQUE: Multiplanar, multiecho pulse sequences of the brain and surrounding
structures were obtained without intravenous contrast.

[Series 5: DWI · axial · 4.0mm · 0.88mm/px · z∈[-51,+89]mm · 4 of 36 slices shown (1 of 6)]
[im 1/36]
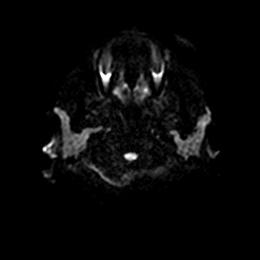
[im 12/36]
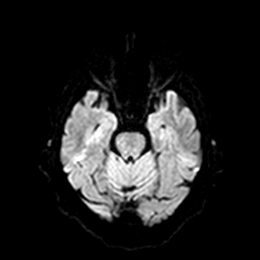
[im 24/36]
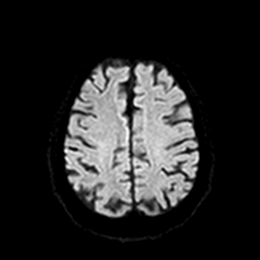
[im 36/36]
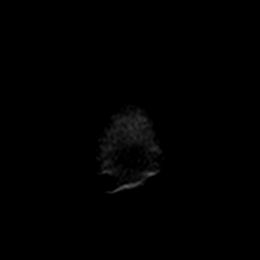

[Series 5: DWI · axial · 4.0mm · 0.88mm/px · z∈[-51,+89]mm · 4 of 36 slices shown (2 of 6)]
[im 1/36]
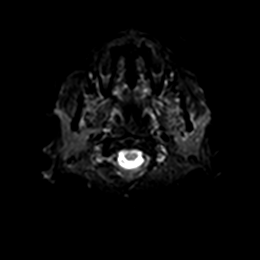
[im 12/36]
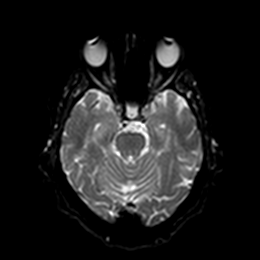
[im 24/36]
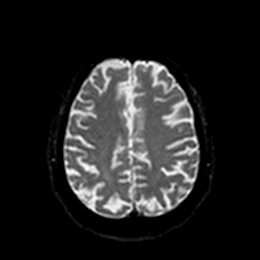
[im 36/36]
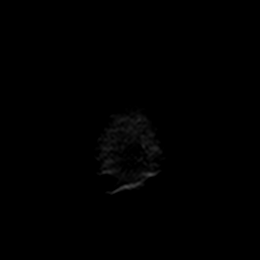

[Series 6: DWI · axial · 4.0mm · 0.88mm/px · z∈[-51,+89]mm · 5 of 36 slices shown (3 of 6)]
[im 1/36]
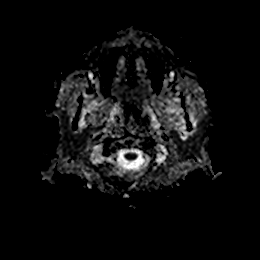
[im 9/36]
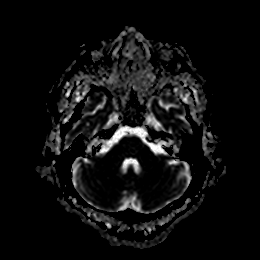
[im 18/36]
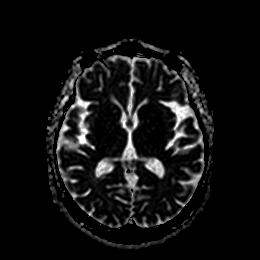
[im 27/36]
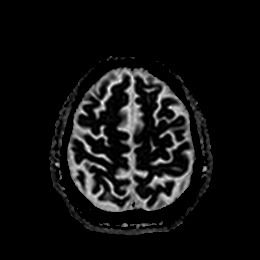
[im 36/36]
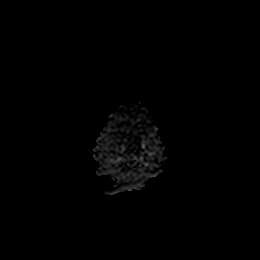

[Series 7: DWI · coronal · 5.0mm · 0.88mm/px · 4 of 28 slices shown (4 of 6)]
[im 1/28]
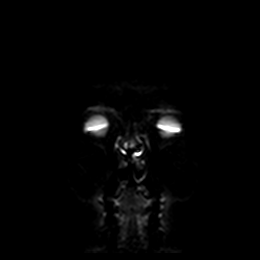
[im 10/28]
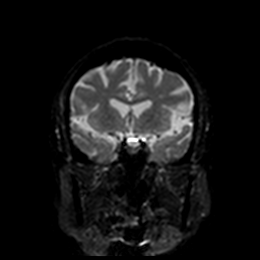
[im 19/28]
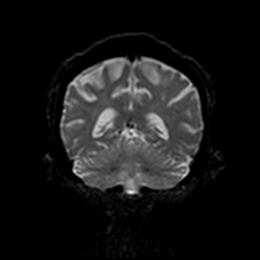
[im 28/28]
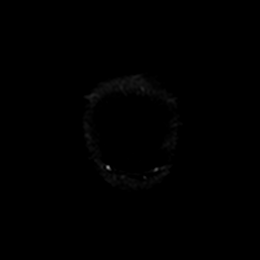

[Series 7: DWI · coronal · 5.0mm · 0.88mm/px · 4 of 28 slices shown (5 of 6)]
[im 1/28]
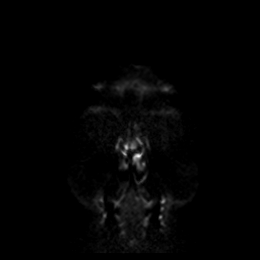
[im 10/28]
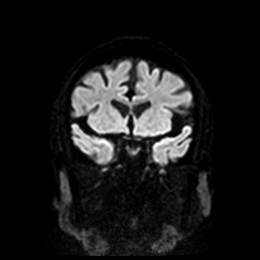
[im 19/28]
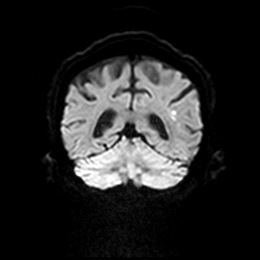
[im 28/28]
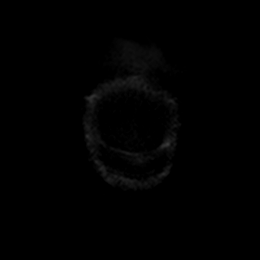

[Series 8: DWI · coronal · 5.0mm · 0.88mm/px · 4 of 28 slices shown (6 of 6)]
[im 1/28]
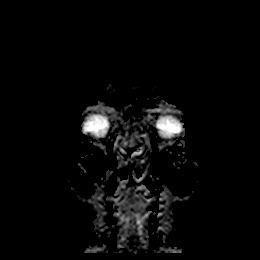
[im 10/28]
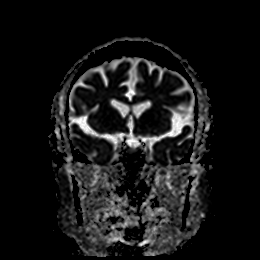
[im 19/28]
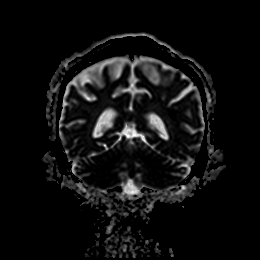
[im 28/28]
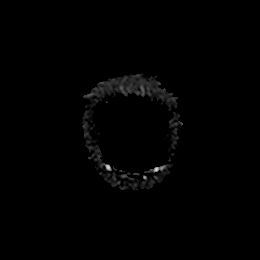

[Series 9: T1 · sagittal · 5.0mm · 0.94mm/px · 3 of 21 slices shown]
[im 1/21]
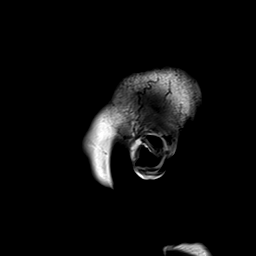
[im 11/21]
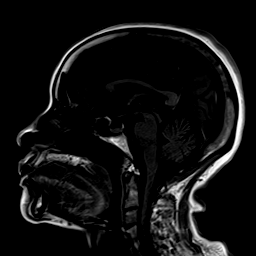
[im 21/21]
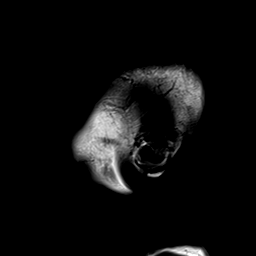

[Series 10: T2 · axial · 5.0mm · 0.72mm/px · z∈[-47,+86]mm · 3 of 20 slices shown (1 of 2)]
[im 1/20]
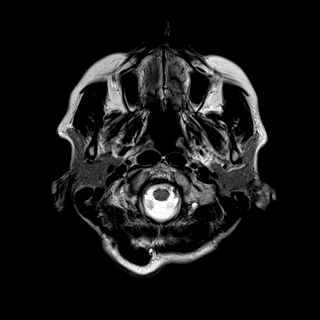
[im 10/20]
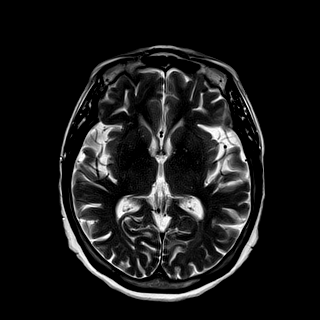
[im 20/20]
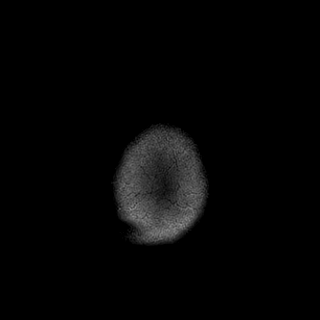

[Series 11: ax hemo · axial · 5.0mm · 0.86mm/px · z∈[-52,+91]mm · 3 of 25 slices shown]
[im 1/25]
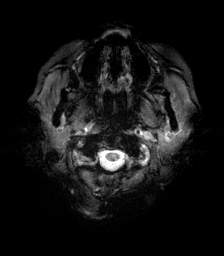
[im 13/25]
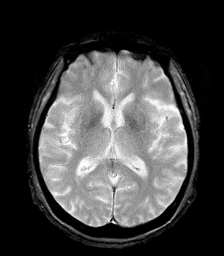
[im 25/25]
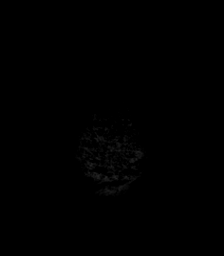

[Series 12: FLAIR · axial · 4.0mm · 0.43mm/px · z∈[-42,+81]mm · 4 of 32 slices shown]
[im 1/32]
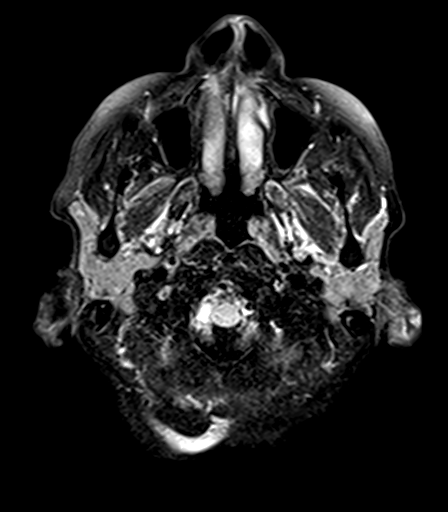
[im 11/32]
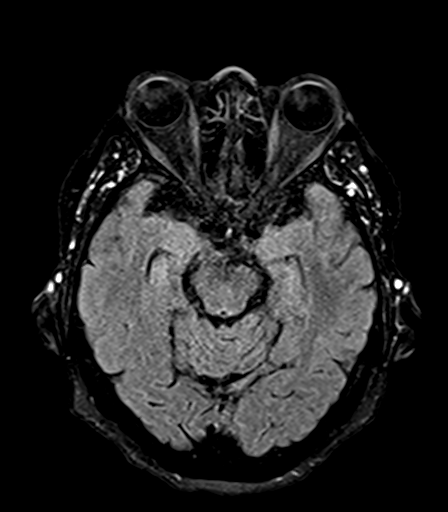
[im 21/32]
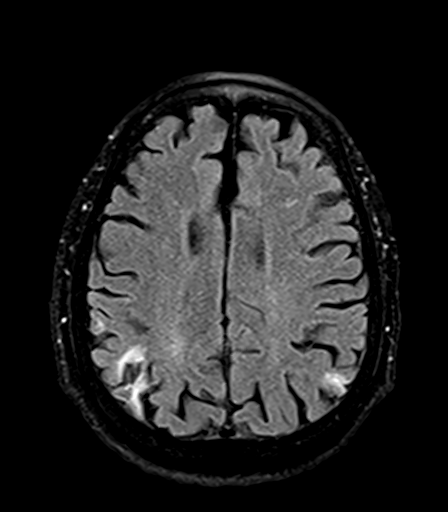
[im 32/32]
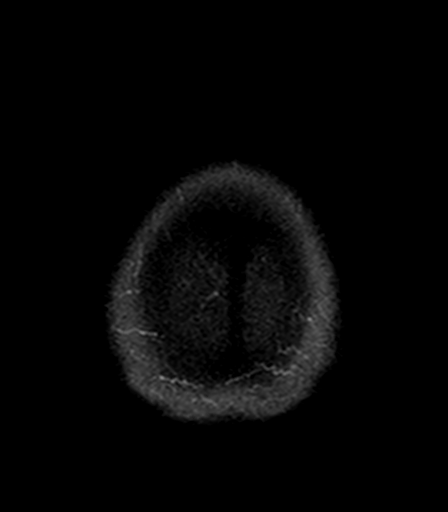

[Series 14: T2 · coronal · 5.0mm · 0.72mm/px · 4 of 28 slices shown (2 of 2)]
[im 1/28]
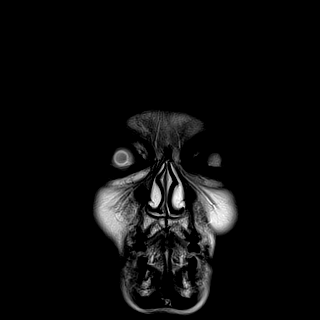
[im 10/28]
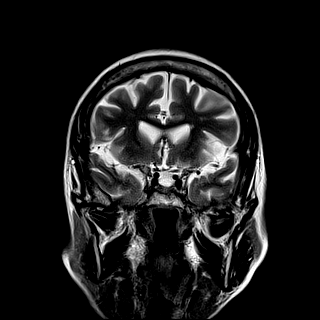
[im 19/28]
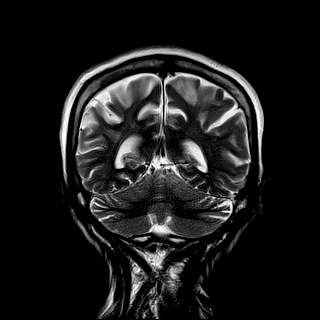
[im 28/28]
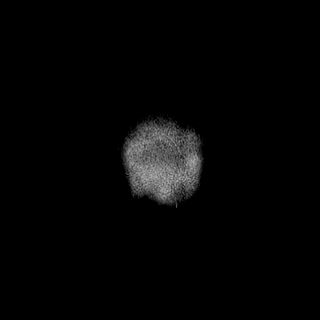

[42 of 48 positions shown; findings below may reference images not displayed]

FINDINGS: Brain: Small foci of reduced diffusion involving the left insula and
left parietal cortex/subcortical white matter near the operculum. No
evidence of intracranial hemorrhage. There is no intracranial mass
or mass effect. Chronic cortical/subcortical infarcts of the right
greater than left parietal lobes and bilateral frontal lobes.
Minimal additional patchy T2 hyperintensity in the supratentorial
white matter is nonspecific but may reflect minor chronic
microvascular ischemic changes. There is no hydrocephalus or
extra-axial fluid collection.

Vascular: Major vessel flow voids at the skull base are preserved.

Skull and upper cervical spine: Normal marrow signal is preserved.

Sinuses/Orbits: Paranasal sinuses are aerated. Orbits are
unremarkable.

Other: Sella is unremarkable.  Mastoid air cells are clear.
IMPRESSION: Two small acute infarcts of the left MCA territory.  No hemorrhage.

Chronic infarcts as described.

## 2024-02-21 IMAGING — US US EXTREM LOW VENOUS
1 series · 13 of 24 positions shown · non-contrast
Comparison: None Available.

CLINICAL DATA: 75-year-old female with possible DVT



[Series 1: us venous img lower bilat (dvt) · portal-venous · 13 of 71 slices shown]
[im 1/71]
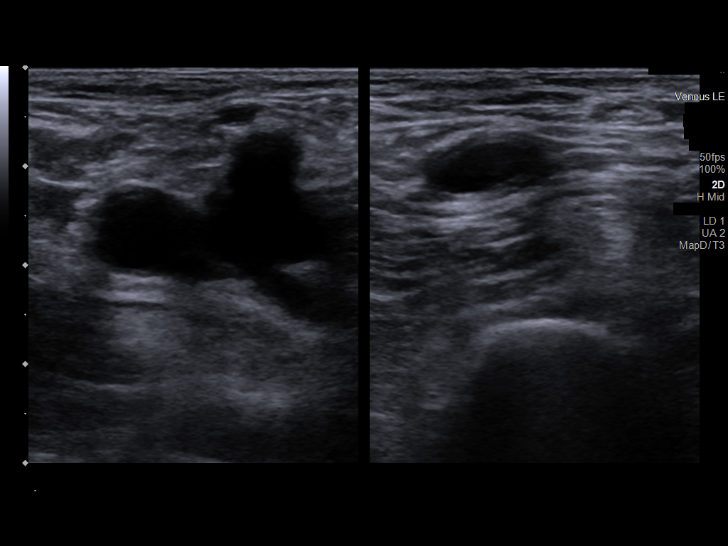
[im 7/71]
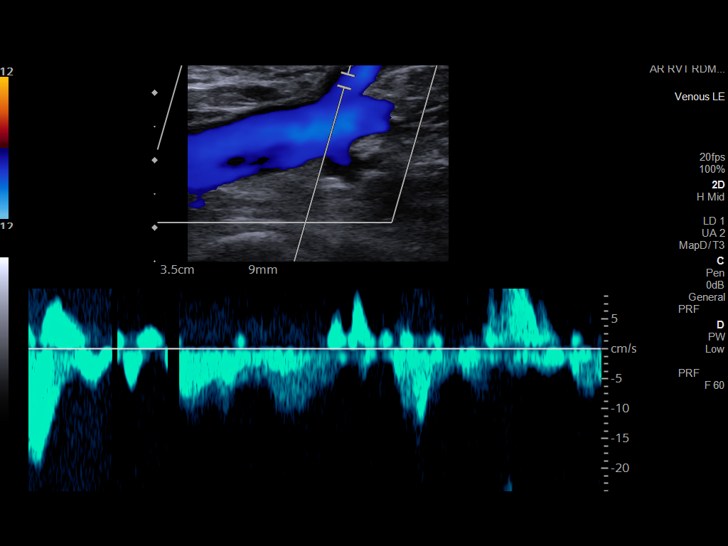
[im 13/71]
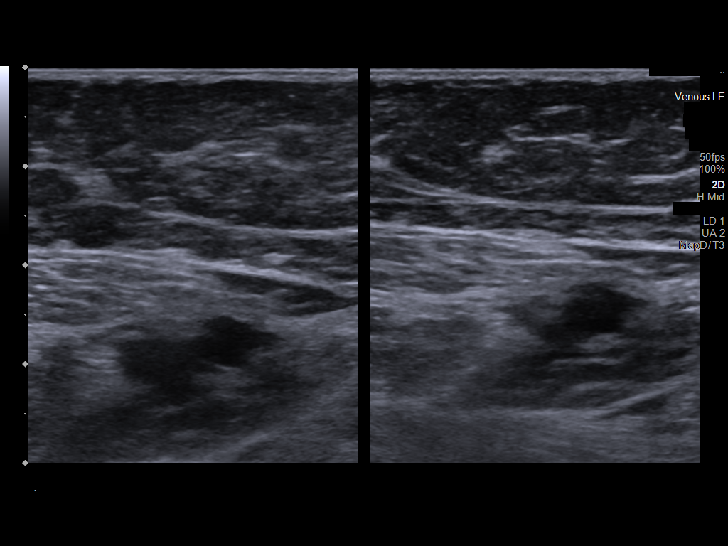
[im 19/71]
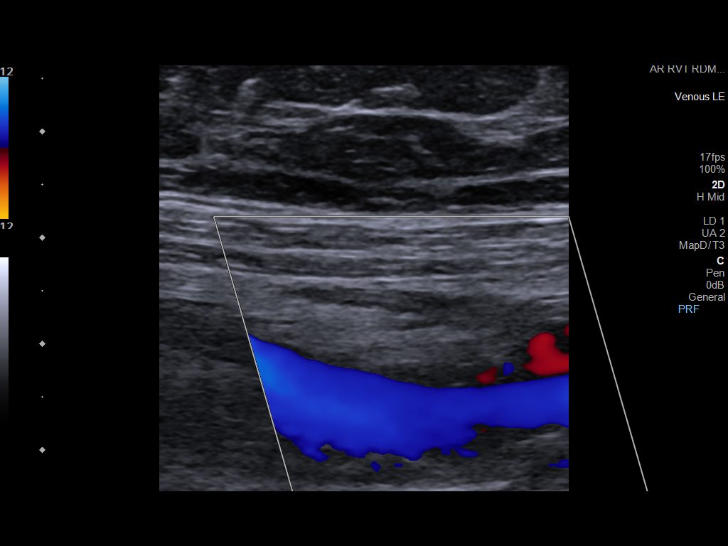
[im 25/71]
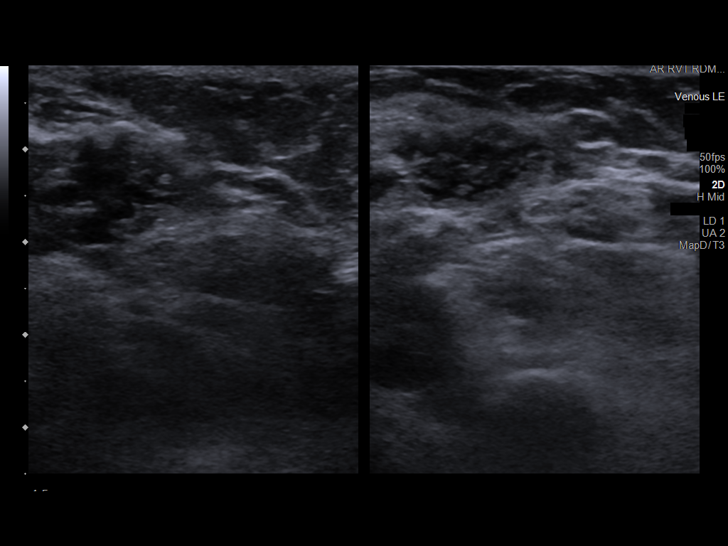
[im 31/71]
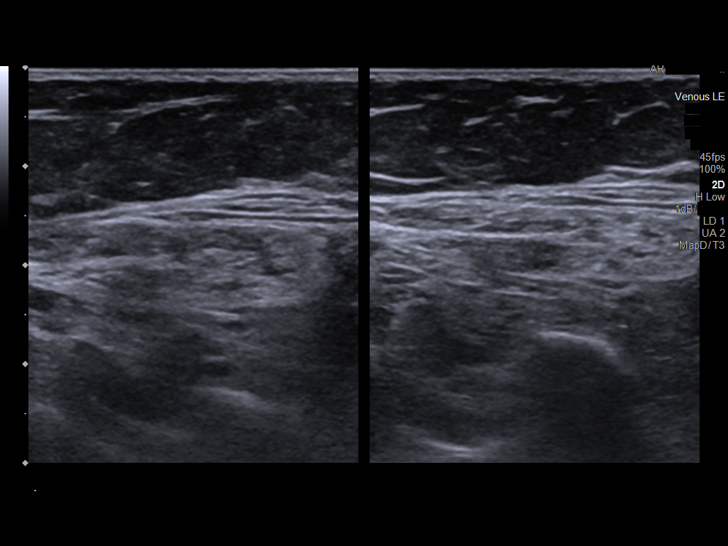
[im 37/71]
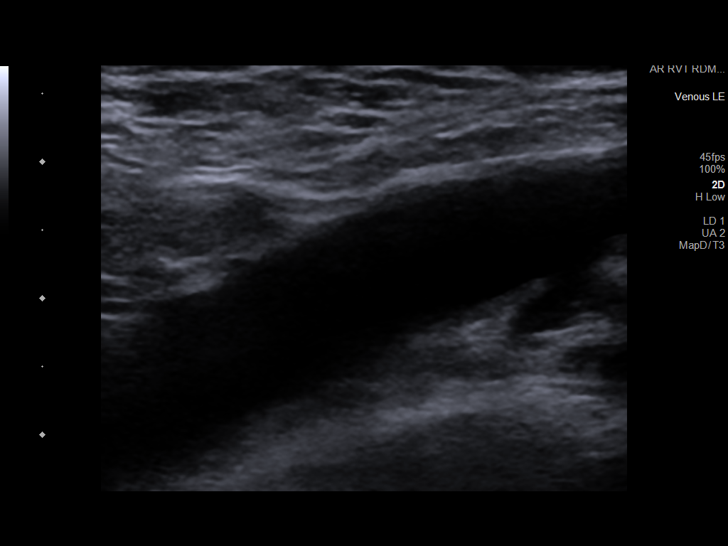
[im 40/71]
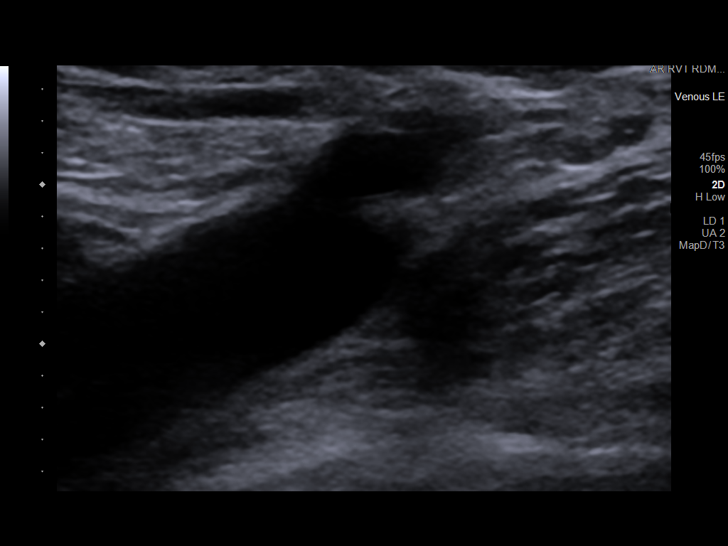
[im 46/71]
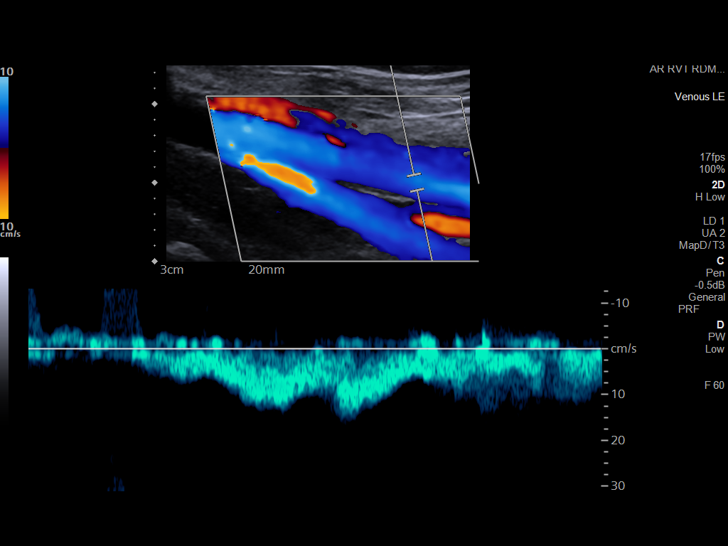
[im 52/71]
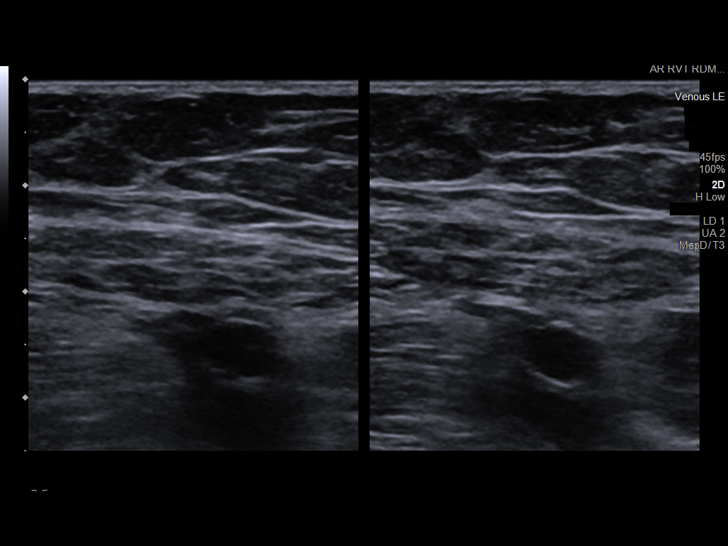
[im 58/71]
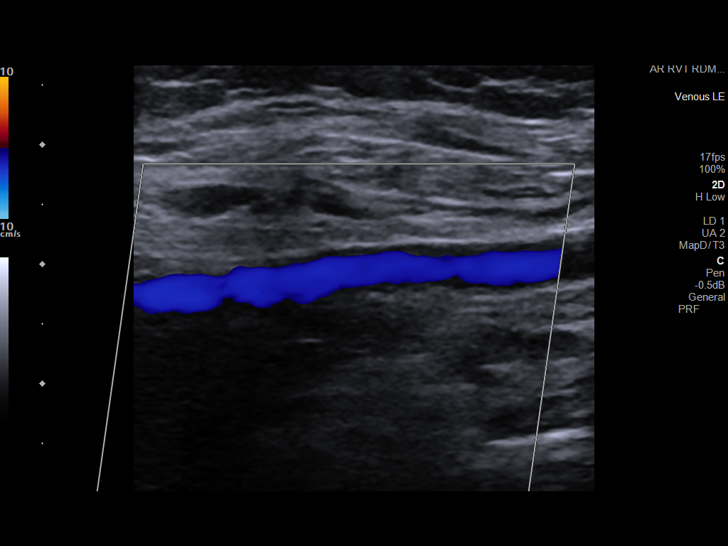
[im 64/71]
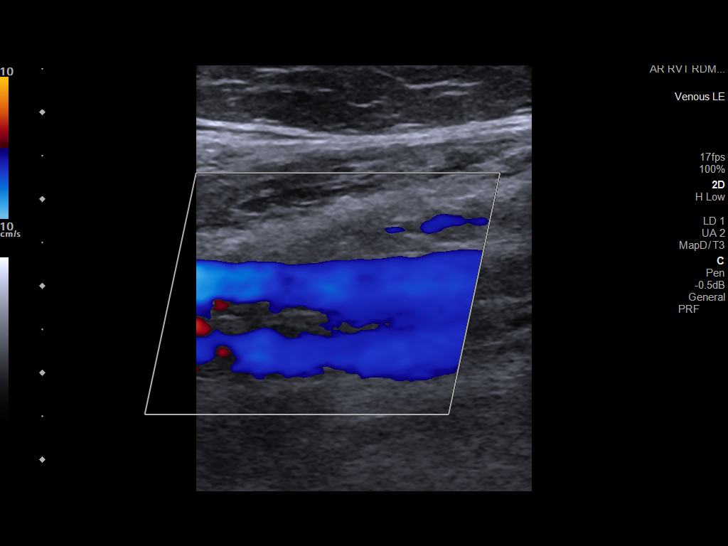
[im 71/71]
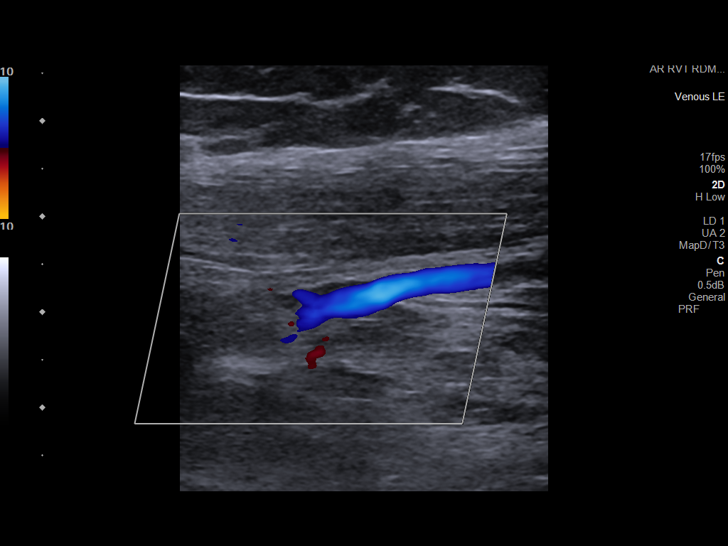

[13 of 24 positions shown; findings below may reference images not displayed]

FINDINGS: RIGHT LOWER EXTREMITY

Common Femoral Vein: No evidence of thrombus. Normal
compressibility, respiratory phasicity and response to augmentation.

Saphenofemoral Junction: No evidence of thrombus. Normal
compressibility and flow on color Doppler imaging.

Profunda Femoral Vein: No evidence of thrombus. Normal
compressibility and flow on color Doppler imaging.

Femoral Vein: No evidence of thrombus. Normal compressibility,
respiratory phasicity and response to augmentation.

Popliteal Vein: No evidence of thrombus. Normal compressibility,
respiratory phasicity and response to augmentation.

Calf Veins: No evidence of thrombus. Normal compressibility and flow
on color Doppler imaging.

Superficial Great Saphenous Vein: No evidence of thrombus. Normal
compressibility and flow on color Doppler imaging.

Other Findings:  None.

LEFT LOWER EXTREMITY

Common Femoral Vein: No evidence of thrombus. Normal
compressibility, respiratory phasicity and response to augmentation.

Saphenofemoral Junction: No evidence of thrombus. Normal
compressibility and flow on color Doppler imaging.

Profunda Femoral Vein: No evidence of thrombus. Normal
compressibility and flow on color Doppler imaging.

Femoral Vein: No evidence of thrombus. Normal compressibility,
respiratory phasicity and response to augmentation.

Popliteal Vein: No evidence of thrombus. Normal compressibility,
respiratory phasicity and response to augmentation.

Calf Veins: No evidence of thrombus. Normal compressibility and flow
on color Doppler imaging.

Superficial Great Saphenous Vein: No evidence of thrombus. Normal
compressibility and flow on color Doppler imaging.

Other Findings:  None.
IMPRESSION: Directed duplex of the lower extremities negative for DVT

## 2024-02-22 ENCOUNTER — Encounter: Payer: Self-pay | Admitting: Internal Medicine

## 2024-02-25 NOTE — Telephone Encounter (Signed)
 Spoke to daughter    Let her know that I spoke to her mother last week I am not convinced there was thrombus on TEE Would keep on Eliquis  bid Cancel tomorrow's appt Keep appt in September

## 2024-02-25 NOTE — Telephone Encounter (Signed)
 Appt made with Dr. Okey to discuss TEE results. Daughter and pt aware.

## 2024-02-26 ENCOUNTER — Ambulatory Visit: Admitting: Internal Medicine

## 2024-02-26 NOTE — Telephone Encounter (Signed)
 Spoke with patient to confirm cancellation of appt today per Dr. Okey. Patient states she is aware and verbalized understanding. She will keep appt with APP on 9/5. Patient expressed appreciation for follow-up.

## 2024-02-26 NOTE — Telephone Encounter (Signed)
 Received msg from Dr. Okey stating that she has spoken with daughter and today's appt can be canceled.

## 2024-03-01 ENCOUNTER — Ambulatory Visit: Payer: Self-pay | Admitting: Nurse Practitioner

## 2024-03-04 MED ORDER — PERFLUTREN LIPID MICROSPHERE
1.0000 mL | INTRAVENOUS | Status: AC | PRN
  Administered 2024-03-04: 2 mL via INTRAVENOUS

## 2024-03-14 ENCOUNTER — Encounter: Payer: Self-pay | Admitting: Nurse Practitioner

## 2024-03-14 ENCOUNTER — Ambulatory Visit: Attending: Nurse Practitioner | Admitting: Nurse Practitioner

## 2024-03-14 VITALS — BP 118/64 | HR 76 | Ht 61.0 in | Wt 131.0 lb

## 2024-03-14 DIAGNOSIS — I480 Paroxysmal atrial fibrillation: Secondary | ICD-10-CM | POA: Diagnosis not present

## 2024-03-14 DIAGNOSIS — E785 Hyperlipidemia, unspecified: Secondary | ICD-10-CM

## 2024-03-14 DIAGNOSIS — I272 Pulmonary hypertension, unspecified: Secondary | ICD-10-CM

## 2024-03-14 DIAGNOSIS — R062 Wheezing: Secondary | ICD-10-CM

## 2024-03-14 DIAGNOSIS — I38 Endocarditis, valve unspecified: Secondary | ICD-10-CM | POA: Diagnosis not present

## 2024-03-14 DIAGNOSIS — I10 Essential (primary) hypertension: Secondary | ICD-10-CM

## 2024-03-14 DIAGNOSIS — I639 Cerebral infarction, unspecified: Secondary | ICD-10-CM | POA: Diagnosis not present

## 2024-03-14 NOTE — Progress Notes (Signed)
 Cardiology Office Note   Date: 03/14/2024 ID:  Misty Hughes, DOB 1945-08-01, MRN [id number] PCP: [facility]  [facility] Providers Cardiologist:  Vina Gull, MD     History of Present Illness Misty Hughes is a 78 y.o. female with a PMH of CVA, A-fib, valvular heart disease, HTN, and HLD, who presents today for scheduled follow-up.   Last seen by Dr. Gull on September 04, 2023. Was overall doing well at the time.   Recently hospitalized d/t occiptal stroke. Came in with stroke like symptoms. Vision improved slightly. See MRI/MRA of head and CTA of head and neck as outlined below. Echo revealed normal LVEF, see full outline below. No atrial shunt detected by doppler.   02/01/2024 - Today she presents for hospital follow-up. She states she is doing well.  She is compliant with all of her medications and tolerating this well.  Shows me her heart rate and blood pressure log that show excellent readings.  Tells me she was very active and exercise regularly prior to her stroke but has not been exercising since her stroke and wants to make sure she does this correctly.  Doing well from a cardiac standpoint. Denies any chest pain, shortness of breath, palpitations, syncope, presyncope, dizziness, orthopnea, PND, swelling or significant weight changes, acute bleeding, or claudication.  Tells me she had sleep study performed earlier this year that was negative.  03/14/2024 -  Here for follow-up. Doing well. Denies any chest pain, shortness of breath, palpitations, syncope, presyncope, dizziness, orthopnea, PND, swelling or significant weight changes, acute bleeding, or claudication. Does admit to occasional wheezing noted. Had sleep study performed earlier this year that was negative. Does well with exercise and is very active.    ROS: Negative. See HPI.   Studies Reviewed  EKG: EKG is not ordered today.      TEE 01/2024:  1.  Left ventricular ejection fraction, by estimation, is  60 to 65%.  No RWMA, left ventricular diastolic function cannot be evaluated.  Normal systolic function of right ventricle, moderately elevated PASP. 2.  Swirling smoke like pattern is seen in the left atrium and left atrial appendage, Definity  is used to assess for any filling defects in the LAA.  No obvious major filling defects noted however very small filling defect cannot be excluded due to slow filling of LAA and inability to adequately visualize the entire LAA despite using Definity .  Left atrial size was severely dilated.  No left atrial/left atrial appendage thrombus was detected.  The LAA emptying velocity was 20 cm/s.  3.  Right atrial size is massively dilated. 4.  The mitral valve is normal structure.  Trivial MR, no evidence of mitral stenosis. 5.  Tricuspid valve leaflets are thickened.  The tricuspid valve is abnormal.  Tricuspid valve regurgitation is moderate to severe. 6.  The aortic valve is tricuspid.  Aortic valve regurgitation is not visualized.  No aortic valve stenosis present. 7.  Agitated saline contrast bubble study was negative, no evidence of any interatrial shunt.  TTE 01/2024:  1. Left ventricular ejection fraction, by estimation, is 60 to 65%. The  left ventricle has normal function. The left ventricle has no regional  wall motion abnormalities. There is mild left ventricular hypertrophy.  Left ventricular diastolic parameters  are indeterminate.   2. Right ventricular systolic function is mildly reduced. The right  ventricular size is mildly enlarged. There is moderately elevated  pulmonary artery systolic pressure. The estimated right ventricular  systolic pressure is  45.2 mmHg.   3. Left atrial size was severely dilated.   4. Right atrial size was severely dilated.   5. The mitral valve is degenerative. Trivial mitral valve regurgitation.  No evidence of mitral stenosis. Moderate mitral annular calcification.   6. Suspect atrial functional TR. Tricuspid valve  regurgitation is  moderate to severe.   7. The aortic valve is grossly normal. Aortic valve regurgitation is not  visualized. No aortic stenosis is present.   8. The inferior vena cava is normal in size with greater than 50%  respiratory variability, suggesting right atrial pressure of 3 mmHg.   Conclusion(s)/Recommendation(s): No intracardiac source of embolism  detected on this transthoracic study. Consider a transesophageal  echocardiogram to exclude cardiac source of embolism if clinically  indicated.   Cardiac monitor 01/2022:  Monitor shows atrial fibrillation and atrial flutter, rapid at times. Burden was close to 70% of the monitoring time. She is already on anticoagulation.  Physical Exam VS:  BP 118/64 (BP Location: Left Arm)   Pulse 76   Ht 5' 1 (1.549 m)   Wt 131 lb (59.4 kg)   SpO2 95%   BMI 24.75 kg/m   Wt Readings from Last 3 Encounters:  03/20/24 133 lb (60.3 kg)  03/14/24 131 lb (59.4 kg)  02/07/24 135 lb (61.2 kg)    GEN: Well nourished, well developed in no acute distress NECK: No JVD; No carotid bruits CARDIAC: S1/S2, RRR, no murmurs, rubs, gallops RESPIRATORY:  Clear to auscultation without rales or rhonchi, scattered wheezing noted to bilateral posterior lung fields. ABDOMEN: Soft, non-tender, non-distended EXTREMITIES:  No edema; No deformity   ASSESSMENT AND PLAN  Recurrent CVA's Etiology unclear. Most recent Echo showed no intracardiac source of embolism detected on TTE was was recommended to consider TEE to exclude cardiac source of embolism if clinically indicated.  TEE noted above was negative for any left atrial/left atrial appendage thrombus detected.  She is compliant with Eliquis . Continue current medication regimen. Heart healthy diet and regular cardiovascular exercise encouraged.     2. Paroxysmal A-fib Denies any tachycardia or palpitations. Heart rate is well-controlled.  Continue current medication regimen.  Continue Eliquis  for stroke  prevention.  She is on appropriate dosage denies any bleeding issues.  2. Valvular heart disease, pulmonary HTN Echocardiogram from January 19, 2024 showed mildly reduced right ventricular systolic function with moderately elevated PASP, estimated right ventricular systolic pressure was found to be 45.2 mmHg.  PASP stable since 2023.  She continues to have stable pulmonary hypertension noted on TEE-see results above.  Denies any symptoms.  Etiology unclear. Tells me she was tested for sleep apnea earlier this year and was found to be negative.  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  If she were to become more symptomatic in the future, consider referral to structural heart team.  3. HTN Blood pressure stable and well-controlled. Discussed to monitor BP at home at least 2 hours after medications and sitting for 5-10 minutes.  No medication changes at this time. Heart healthy diet and regular cardiovascular exercise encouraged.   4. HLD Most recent LDL 57.  She is currently at goal.  Continue atorvastatin . Heart healthy diet and regular cardiovascular exercise encouraged.   5. Expiratory wheezing Noted on exam today, mild, and history of asthma that has been chronic.  Encouraged her to continue follow-up with PCP. Care and ED precautions discussed.      Dispo: Follow-up with  MD/APP in 6 months or sooner if any changes.  Plan to update echocardiogram during that time.  Signed, Almarie Crate, NP

## 2024-03-14 NOTE — Patient Instructions (Signed)

## 2024-03-20 ENCOUNTER — Other Ambulatory Visit: Payer: Self-pay

## 2024-03-20 ENCOUNTER — Encounter (HOSPITAL_COMMUNITY): Payer: Self-pay

## 2024-03-20 ENCOUNTER — Emergency Department (HOSPITAL_COMMUNITY)
Admission: EM | Admit: 2024-03-20 | Discharge: 2024-03-20 | Disposition: A | Discharge status: Home / Self Care | Attending: Emergency Medicine | Admitting: Emergency Medicine

## 2024-03-20 ENCOUNTER — Emergency Department (HOSPITAL_COMMUNITY)

## 2024-03-20 DIAGNOSIS — Z7901 Long term (current) use of anticoagulants: Secondary | ICD-10-CM | POA: Diagnosis not present

## 2024-03-20 DIAGNOSIS — M48061 Spinal stenosis, lumbar region without neurogenic claudication: Secondary | ICD-10-CM | POA: Diagnosis not present

## 2024-03-20 DIAGNOSIS — R109 Unspecified abdominal pain: Secondary | ICD-10-CM | POA: Diagnosis not present

## 2024-03-20 DIAGNOSIS — M545 Low back pain, unspecified: Secondary | ICD-10-CM | POA: Diagnosis present

## 2024-03-20 LAB — URINALYSIS, ROUTINE W REFLEX MICROSCOPIC
Bilirubin Urine: NEGATIVE
Glucose, UA: NEGATIVE mg/dL
Hgb urine dipstick: NEGATIVE
Ketones, ur: NEGATIVE mg/dL
Leukocytes,Ua: NEGATIVE
Nitrite: NEGATIVE
Protein, ur: NEGATIVE mg/dL
Specific Gravity, Urine: 1.003 — ABNORMAL LOW (ref 1.005–1.030)
pH: 6 (ref 5.0–8.0)

## 2024-03-20 MED ORDER — HYDROCODONE-ACETAMINOPHEN 5-325 MG PO TABS: 1.0000 | ORAL_TABLET | Freq: Once | ORAL | Status: DC

## 2024-03-20 MED ORDER — LIDOCAINE 5 % EX PTCH: 1.0000 | MEDICATED_PATCH | CUTANEOUS | 0 refills | Status: AC

## 2024-03-20 MED ORDER — TRAMADOL HCL 50 MG PO TABS: 50.0000 mg | ORAL_TABLET | Freq: Four times a day (QID) | ORAL | 0 refills | Status: DC | PRN

## 2024-03-20 NOTE — ED Provider Notes (Signed)
 [place] EMERGENCY DEPARTMENT AT [facility] Provider Note   CSN: [id number] Arrival date & time: 03/20/24  9243     Patient presents with: Back Pain   Misty Hughes is a 78 y.o. female.  She is with a complaint of left-sided low back pain that started about a week ago.  No known trauma.  No prior history of back problems.  Pain has been there constant.  Worse with bending twisting.  No urinary symptoms.  Pain is in the left lower back into the buttock area but does not radiate down the leg.  No radiation abdomen.  No numbness or weakness.  Tried some exercises that her doctor prescribed her but they did not help.  Has not tried any medication for it.  Rates the pain as severe.  No fevers chills nausea vomiting.  No bowel or bladder incontinence.  Was able to ambulate into the department   The history is provided by the patient.  Back Pain Location:  Gluteal region and lumbar spine Quality:  Aching Radiates to:  Does not radiate Pain severity:  Severe Pain is:  Same all the time Onset quality:  Gradual Duration:  1 week Timing:  Constant Progression:  Unchanged Chronicity:  New Context: not recent injury   Relieved by:  Nothing Worsened by:  Bending and twisting Ineffective treatments: exercise. Associated symptoms: no abdominal pain, no bladder incontinence, no bowel incontinence, no chest pain, no fever, no leg pain, no numbness and no weakness        Prior to Admission medications   Medication Sig Start Date End Date Taking? Authorizing Provider  albuterol (VENTOLIN HFA) 108 (90 Base) MCG/ACT inhaler Inhale 1-2 puffs into the lungs every 6 (six) hours as needed for wheezing or shortness of breath (asthma).    [provider]  amLODipine (NORVASC) 2.5 MG tablet Take 2.5 mg by mouth daily. 11/23/21   [provider]  atorvastatin  (LIPITOR) 40 MG tablet TAKE 1 TABLET(40 MG) BY MOUTH EVERY EVENING 01/24/24   [name], MD  Calcium   Carb-Cholecalciferol  (CALCIUM  + D3 PO) Take 1 tablet by mouth daily.    [provider]  ELIQUIS  5 MG TABS tablet TAKE 1 TABLET(5 MG) BY MOUTH TWICE DAILY 12/31/23   [name], MD  famotidine  (PEPCID ) 20 MG tablet Take 1 tablet (20 mg total) by mouth 2 (two) times daily. 01/19/24 01/18/25  ShahmehdiAdriana LABOR, MD  ferrous sulfate  325 (65 FE) MG tablet Take 325 mg by mouth daily with breakfast. OTC    [provider]  fexofenadine  (ALLEGRA ) 180 MG tablet Take 1 tablet (180 mg total) by mouth daily as needed for allergies or rhinitis. 11/21/21   [name], [name], MD  furosemide (LASIX) 20 MG tablet Take 10 mg by mouth daily. Patient not taking: Reported on 03/14/2024 12/25/23   [provider]  furosemide (LASIX) 20 MG tablet Take 10 mg by mouth daily. 02/15/24   [provider]  metoprolol  tartrate (LOPRESSOR ) 25 MG tablet TAKE 1/2 TABLET(12.5 MG) BY MOUTH TWICE DAILY 05/24/23   [name], [name], MD  Multiple Vitamin (MULTIVITAMIN WITH MINERALS) TABS tablet Take 1 tablet by mouth daily.    [provider]  Omega 3-6-9 CAPS Take 1 capsule by mouth daily.    [provider]  polyethylene glycol (MIRALAX  / GLYCOLAX ) 17 g packet Take 17 g by mouth daily. 10/18/22   Haze [name], MD  potassium chloride  SA (KLOR-CON  M20) 20 MEQ tablet  Take 1 tablet (20 mEq total) by mouth daily. 05/11/23 01/31/25  [name], MD  triamterene -hydrochlorothiazide (DYAZIDE) 37.5-25 MG capsule Take 1 capsule by mouth every morning. 06/26/23   [provider]    Allergies: Patient has no known allergies.    Review of Systems  Constitutional:  Negative for fever.  Cardiovascular:  Negative for chest pain.  Gastrointestinal:  Negative for abdominal pain and bowel incontinence.  Genitourinary:  Negative for bladder incontinence.  Musculoskeletal:  Positive for back pain.  Neurological:  Negative for weakness and numbness.    Updated Vital Signs Temp 98  F (36.7 C) (Oral)   Ht 5' 1 (1.549 m)   Wt 60.3 kg   BMI 25.13 kg/m   Physical Exam Vitals and nursing note reviewed.  Constitutional:      General: She is not in acute distress.    Appearance: Normal appearance. She is well-developed.  HENT:     Head: Normocephalic and atraumatic.  Eyes:     Conjunctiva/sclera: Conjunctivae normal.  Cardiovascular:     Rate and Rhythm: Normal rate and regular rhythm.     Heart sounds: No murmur heard. Pulmonary:     Effort: Pulmonary effort is normal. No respiratory distress.     Breath sounds: Normal breath sounds. No stridor. No wheezing.  Abdominal:     Palpations: Abdomen is soft.     Tenderness: There is no abdominal tenderness. There is no guarding or rebound.  Musculoskeletal:        General: No tenderness or deformity.     Cervical back: Neck supple.     Comments: Nontender lumbar and thoracic midline spine.  No reproducible paralumbar tenderness.  No overlying skin changes  Skin:    General: Skin is warm and dry.  Neurological:     General: No focal deficit present.     Mental Status: She is alert.     GCS: GCS eye subscore is 4. GCS verbal subscore is 5. GCS motor subscore is 6.     Sensory: No sensory deficit.     Motor: No weakness.     (all labs ordered are listed, but only abnormal results are displayed) Labs Reviewed  URINALYSIS, ROUTINE W REFLEX MICROSCOPIC - Abnormal; Notable for the following components:      Result Value   Color, Urine COLORLESS (*)    Specific Gravity, Urine 1.003 (*)    All other components within normal limits    EKG: None  Radiology: CT Renal Stone Study Result Date: 03/20/2024 CLINICAL DATA:  Abdominal/flank pain, stone suspected left EXAM: CT ABDOMEN AND PELVIS WITHOUT CONTRAST TECHNIQUE: Multidetector CT imaging of the abdomen and pelvis was performed following the standard protocol without IV contrast. RADIATION DOSE REDUCTION: This exam was performed according to the departmental  dose-optimization program which includes automated exposure control, adjustment of the mA and/or kV according to patient size and/or use of iterative reconstruction technique. COMPARISON:  10/18/2022 FINDINGS: Of note, the lack of intravenous contrast limits evaluation of the solid organ parenchyma and vascularity. Lower chest: No focal airspace consolidation or pleural effusion. Mild cardiomegaly. Hepatobiliary: No mass.Unchanged hemangioma in the left hepatic lobe.Decompressed gallbladder without radiopaque stones or wall thickening. No intrahepatic or extrahepatic biliary ductal dilation. Pancreas: No mass or main ductal dilation. No peripancreatic inflammation or fluid collection. Spleen: Normal size. No mass. Adrenals/Urinary Tract: No adrenal masses. No renal mass. No hydronephrosis or nephrolithiasis. The urinary bladder is distended without focal abnormality. Stomach/Bowel: The stomach is decompressed without  focal abnormality. No small bowel wall thickening or inflammation. No small bowel obstruction.Normal appendix. Sigmoid colonic diverticulosis. No changes of acute diverticulitis. Moderate volume fecal loading throughout the colon. Vascular/Lymphatic: No aortic aneurysm. Diffuse aortoiliac atherosclerosis. No intraabdominal or pelvic lymphadenopathy. Reproductive: Enlarged uterus containing multiple calcified, involuted fibroids. No concerning adnexal mass. No free pelvic fluid. Other: No pneumoperitoneum, ascites, or mesenteric inflammation. Musculoskeletal: No acute fracture or destructive lesion. Diffuse osteopenia. Mild grade 1 anterolisthesis of L4 on L5. Multilevel degenerative disc disease of the spine. Mild dextrocurvature of the lumbar spine. IMPRESSION: 1. No acute intra-abdominal or pelvic abnormality. 2. Sigmoid diverticulosis. No changes of acute diverticulitis. Moderate volume fecal loading throughout the colon, as can be seen in constipation. Aortic Atherosclerosis (ICD10-I70.0).  Electronically Signed   By: Rogelia Myers M.D.   On: 03/20/2024 09:42   CT L-SPINE NO CHARGE Result Date: 03/20/2024 CLINICAL DATA:  78 year old female with left side abdomen and flank pain. EXAM: CT LUMBAR SPINE WITHOUT CONTRAST TECHNIQUE: Technique: Multiplanar CT images of the lumbar spine were reconstructed from contemporary CT of the Abdomen and Pelvis. RADIATION DOSE REDUCTION: This exam was performed according to the departmental dose-optimization program which includes automated exposure control, adjustment of the mA and/or kV according to patient size and/or use of iterative reconstruction technique. CONTRAST:  None COMPARISON:  Noncontrast CT Abdomen and Pelvis today reported separately. CT Abdomen and Pelvis 10/18/2022. FINDINGS: Segmentation: Normal. Alignment: Moderate thoracolumbar scoliosis. Dextroconvex upper and levoconvex lower lumbar spine curvature. Maintained lumbar lordosis with grade 1 anterolisthesis of L4 on L5. Mild degenerative appearing retrolisthesis at L1-L2 and elsewhere. Alignment is stable from the CT last year. Vertebrae: Chronic benign L5 vertebral Paget's disease with cortical thickening and trabecular coarsening. Background bone mineralization within normal limits. Some levels of chronic lower thoracic interbody ankylosis related to flowing endplate osteophytes. Stable lower thoracic and lumbar vertebral height since last year. No acute osseous abnormality identified. Visible sacrum and SI joints appear intact. Chronic degenerative anterior superior left SI joint ankylosis. Paraspinal and other soft tissues: Abdomen and pelvis reported separately today. Lumbar paraspinal soft tissues are within normal limits. Disc levels: Advanced lumbar spine degeneration superimposed on scoliosis and mild spondylolisthesis. Levels of lower thoracic interbody ankylosis related to hyperostosis. Other lower thoracic vacuum disc. T12-L1: Vacuum disc and circumferential disc osteophyte complex.  No spinal stenosis by CT. L1-L2: Vacuum disc and disc space loss. Mild retrolisthesis. Bulky circumferential disc osteophyte complex asymmetric to the left. Probably no significant spinal stenosis but moderate to severe left lateral recess (descending left L2 nerve level) and left L1 foraminal stenosis. L2-L3: Less pronounced retrolisthesis. Moderate asymmetric disc bulging to the left. Moderate facet and ligament flavum hypertrophy. Mild spinal stenosis is possible. Moderate left lateral recess and left foraminal stenosis suspected (descending left L3 and exiting left L2 nerve levels). L3-L4: Mild retrolisthesis. Disc space loss with more symmetric circumferential disc bulging. Moderate posterior element hypertrophy. Mild spinal stenosis. Symmetric probably mild lateral recess stenosis. L4-L5: Grade 1 anterolisthesis. Circumferential disc/pseudo disc. Moderate to severe facet and ligament flavum hypertrophy. Mild spinal stenosis suspected (series 1, image 78). Symmetric lateral recess stenosis. L5-S1: Vacuum disc. Circumferential disc osteophyte complex. Moderate to severe facet and ligament flavum hypertrophy. Probably no significant spinal stenosis. Moderate to severe bilateral L5 foraminal stenosis. IMPRESSION: 1. No acute osseous abnormality in the Lumbar Spine. Chronic L5 Paget's disease. 2. Advanced lumbar spine degeneration in the setting of thoracolumbar scoliosis, multilevel grade 1 lumbar spondylolisthesis. 3. Mild multifactorial spinal stenosis suspected at L2-L3 through  L4-L5. Moderate to severe lateral recess and/or foraminal neural impingement suspected at the left L1, left L2, left L3, bilateral L5 nerve levels. 4. CT Abdomen and Pelvis today reported separately. Electronically Signed   By: VEAR Hurst M.D.   On: 03/20/2024 09:28     Procedures   Medications Ordered in the ED  HYDROcodone -acetaminophen  (NORCO/VICODIN) 5-325 MG per tablet 1 tablet (has no administration in time range)     Clinical Course as of 03/20/24 1715  Thu Mar 20, 2024  1004 Patient states she is having no back pain now so did not take the pain medicine.  She is comfortable plan for discharge.  I reviewed the results of the ultrasound with her. [MB]    Clinical Course User Index [MB] Towana Ozell BROCKS, MD                                 Medical Decision Making Amount and/or Complexity of Data Reviewed Labs: ordered. Radiology: ordered.  Risk Prescription drug management.   This patient complains of low back pain; this involves an extensive number of treatment Options and is a complaint that carries with it a high risk of complications and morbidity. The differential includes musculoskeletal pain, radiculopathy, spinal stenosis, renal colic, vascular, pyelonephritis  I ordered, reviewed and interpreted labs, which included urinalysis without signs of infection or hematuria I ordered medication oral pain medicine and reviewed PMP when indicated. I ordered imaging studies which included CT renal, CT lumbar spine and I independently    visualized and interpreted imaging which showed no evidence of ureteral stones.  Does have significant degenerative changes and spinal stenosis, no acute fractures Additional history obtained from patient's family member Previous records obtained and reviewed in epic including recent PCP notes Cardiac monitoring reviewed, sinus rhythm Social determinants considered, no significant barriers Critical Interventions: None  After the interventions stated above, I reevaluated the patient and found patient states her pain to be better Admission and further testing considered, no indications for admission.  Recommended close follow-up with PCP for further management.  Return instructions discussed      Final diagnoses:  Acute left-sided low back pain without sciatica  Spinal stenosis of lumbar region, unspecified whether neurogenic claudication present    ED  Discharge Orders          Ordered    traMADol  (ULTRAM ) 50 MG tablet  Every 6 hours PRN        03/20/24 1005    lidocaine  (LIDODERM ) 5 %  Every 24 hours        03/20/24 1005               Towana Ozell BROCKS, MD 03/20/24 [phone]

## 2024-03-20 NOTE — ED Notes (Signed)
 Pt ambulated to the bathroom ind.

## 2024-03-20 NOTE — ED Notes (Signed)
Pt states her pain has improved

## 2024-03-20 NOTE — ED Notes (Signed)
 Pt wants to hold off on the pain pill right now.

## 2024-03-20 NOTE — ED Triage Notes (Signed)
 Pt c/o back pain; onset about a week ago. Left hip, pain located over the glutaeus medius. Pt states pain eases up and she is fine to move, otherwise hurts constantly and she is near immobile. Pt states pain is 10/10. Denies injury; denies N/V/D, headache, or radiating pain. A&Ox4.

## 2024-03-24 ENCOUNTER — Encounter (HOSPITAL_COMMUNITY): Payer: Self-pay | Admitting: Internal Medicine

## 2024-04-23 ENCOUNTER — Encounter (INDEPENDENT_AMBULATORY_CARE_PROVIDER_SITE_OTHER): Payer: Self-pay | Admitting: Gastroenterology

## 2024-05-21 ENCOUNTER — Telehealth: Payer: Self-pay | Admitting: Internal Medicine

## 2024-05-21 MED ORDER — METOPROLOL TARTRATE 25 MG PO TABS: 12.5000 mg | ORAL_TABLET | Freq: Two times a day (BID) | ORAL | 1 refills | Status: AC

## 2024-05-21 NOTE — Telephone Encounter (Signed)
 Refill has been sent.

## 2024-05-21 NOTE — Telephone Encounter (Signed)
*  STAT* If patient is at the pharmacy, call can be transferred to refill team.   1. Which medications need to be refilled? (please list name of each medication and dose if known) metoprolol  tartrate (LOPRESSOR ) 25 MG tablet    2. Would you like to learn more about the convenience, safety, & potential cost savings by using the [facility]? No    3. Are you open to using the [facility] (Type [facility]. No    4. Which pharmacy/location (including street and city if local pharmacy) is medication to be sent to?[facility] [address] [place] - [address]    5. Do they need a 30 day or 90 day supply? 90 day

## 2024-06-02 ENCOUNTER — Telehealth: Payer: Self-pay | Admitting: Internal Medicine

## 2024-06-02 MED ORDER — POTASSIUM CHLORIDE CRYS ER 20 MEQ PO TBCR: 20.0000 meq | EXTENDED_RELEASE_TABLET | Freq: Every day | ORAL | 2 refills | Status: DC

## 2024-06-02 MED ORDER — POTASSIUM CHLORIDE CRYS ER 20 MEQ PO TBCR: 20.0000 meq | EXTENDED_RELEASE_TABLET | Freq: Every day | ORAL | 2 refills | Status: AC

## 2024-06-02 NOTE — Telephone Encounter (Signed)
 Contacted patient . Patient is  aware to continue the Potassium 20 meq  Patient request prescription to [facility]   Done

## 2024-06-02 NOTE — Telephone Encounter (Signed)
 Pt c/o medication issue:  1. Name of Medication:   potassium chloride  SA (KLOR-CON  M20) 20 MEQ tablet    2. How are you currently taking this medication (dosage and times per day)? As written   3. Are you having a reaction (difficulty breathing--STAT)? No   4. What is your medication issue? Pt wanted to clarify if medication is still needed if it is she would like a new RX please advise

## 2024-06-30 ENCOUNTER — Telehealth: Payer: Self-pay | Admitting: Internal Medicine

## 2024-06-30 DIAGNOSIS — I4891 Unspecified atrial fibrillation: Secondary | ICD-10-CM

## 2024-06-30 MED ORDER — APIXABAN 5 MG PO TABS: 5.0000 mg | ORAL_TABLET | Freq: Two times a day (BID) | ORAL | 1 refills | Status: AC

## 2024-06-30 NOTE — Telephone Encounter (Signed)
 Pt last saw Almarie Crate, NP on 03/14/24, last labs 06/27/24 Creat 0.87, age 78, weight 60.3kg, based on specified criteria pt is on appropriate dosage of Eliquis  5mg  BID for afib/CVA.  Will refill rx.

## 2024-06-30 NOTE — Telephone Encounter (Signed)
" °*  STAT* If patient is at the pharmacy, call can be transferred to refill team.   1. Which medications need to be refilled? (please list name of each medication and dose if known) ELIQUIS  5 MG TABS tablet    2. Would you like to learn more about the convenience, safety, & potential cost savings by using the [facility]? No    3. Are you open to using the [facility] ([facility].  ).no    4. Which pharmacy/location (including street and city if local pharmacy) is medication to be sent to?[facility] [address] [place] - [address]    5. Do they need a 30 day or 90 day supply? 90 day    Pt is out of medication   "

## 2024-08-11 NOTE — Progress Notes (Unsigned)
 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  The diplopia wouldn't be explained by the right occipital infarct raising possibility that she may have had a small MRI-negative brainstem stroke as well. Atrial fibrillation Hypertension Hyperlipidemia   Eliquis  Atorvastatin .  LDL goal less than 70 Normotensive blood pressure Hgb A1c goal less than 7 Continue routine exercise Mediterranean diet Follow up 6 months.  Total time in chart and face to face with patient:  22 minutes.  Subjective:  Misty Hughes is a 79 year old right-handed female with a fib, HTN, and HLD and history of right parietal stroke who follows up for stroke.  UPDATE: Current medications:  Eliquis , atorvastatin  40mg , amlodipine, furosemide, metoprolol  tartrate, traimterene-HCTZ  Feeling well.  No new symptoms.    Labs from 06/27/2024 revealed Hgb A1c 5.2 and LDL 68.  HISTORY: On 01/18/2024, patient developed hazy vision.  Resolved when closing either eye.  No headache, facial droop or unilateral numbness and weakness.  Presented to [facility].  MRI of brain showed acute small right occipital ischemic infarct.  CTA of head and neck showed atherosclerotic calcifications in the ICA siphons but no LVO or high-grade stenosis.  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  LDL was 57 and Hgb A1c was 5.1.  She has atrial fibrillation and takes Eliquis .  Hasn't missed a dose.    PAST MEDICAL HISTORY: Past Medical History:  Diagnosis Date   Allergic rhinitis    Asthma    Hyperlipidemia LDL goal <70    Hypertension    Stroke (HCC) 11/21/2021    MEDICATIONS: Medications Ordered Prior to Encounter[1]  ALLERGIES: Allergies[2]  FAMILY HISTORY: Family History  Problem Relation Age of Onset   Hypertension  Mother    CVA Mother    Hypertension Father    Hypertension Sister    Sleep apnea Neg Hx       Objective:  Blood pressure (!) 149/74, pulse 83, height 5' 1 (1.549 m), weight 138 lb 3.2 oz (62.7 kg), SpO2 97%. General: No acute distress.  Patient appears well-groomed.   Head:  Normocephalic/atraumatic Eyes:  Fundi examined but not visualized Neck: supple, no paraspinal tenderness, full range of motion Heart:  Regular rate and rhythm Neurological Exam: alert and oriented.  Speech fluent and not dysarthric, language intact.  CN II-XII intact. Bulk and tone normal, muscle strength 5/5 throughout.  Sensation to light touch intact.  Deep tendon reflexes 2+ throughout, toes downgoing.  Finger to nose testing intact.  Gait normal, Romberg negative.   Juliene Dunnings, DO            [1]  Current Outpatient Medications on File Prior to Visit  Medication Sig Dispense Refill   albuterol (VENTOLIN HFA) 108 (90 Base) MCG/ACT inhaler Inhale 1-2 puffs into the lungs every 6 (six) hours as needed for wheezing or shortness of breath (asthma).     amLODipine (NORVASC) 2.5 MG tablet Take 2.5 mg by mouth daily.     apixaban  (ELIQUIS ) 5 MG TABS tablet Take 1 tablet (5 mg total) by mouth 2 (two) times daily. 180 tablet 1   atorvastatin  (LIPITOR) 40 MG tablet TAKE 1 TABLET(40 MG) BY MOUTH EVERY EVENING 90 tablet 2   Calcium  Carb-Cholecalciferol  (CALCIUM  + D3 PO) Take 1 tablet by mouth daily.     famotidine  (PEPCID ) 20 MG  tablet Take 1 tablet (20 mg total) by mouth 2 (two) times daily. 60 tablet 1   ferrous sulfate  325 (65 FE) MG tablet Take 325 mg by mouth daily with breakfast. OTC     fexofenadine  (ALLEGRA ) 180 MG tablet Take 1 tablet (180 mg total) by mouth daily as needed for allergies or rhinitis.     furosemide (LASIX) 20 MG tablet Take 10 mg by mouth daily. (Patient not taking: Reported on 03/14/2024)     furosemide (LASIX) 20 MG tablet Take 10 mg by mouth daily.     lidocaine  (LIDODERM ) 5 % Place 1  patch onto the skin daily. Remove & Discard patch within 12 hours or as directed by MD 30 patch 0   metoprolol  tartrate (LOPRESSOR ) 25 MG tablet Take 0.5 tablets (12.5 mg total) by mouth 2 (two) times daily. 90 tablet 1   Multiple Vitamin (MULTIVITAMIN WITH MINERALS) TABS tablet Take 1 tablet by mouth daily.     Omega 3-6-9 CAPS Take 1 capsule by mouth daily.     polyethylene glycol (MIRALAX  / GLYCOLAX ) 17 g packet Take 17 g by mouth daily. 14 each 0   potassium chloride  SA (KLOR-CON  M20) 20 MEQ tablet Take 1 tablet (20 mEq total) by mouth daily. 90 tablet 2   traMADol  (ULTRAM ) 50 MG tablet Take 1 tablet (50 mg total) by mouth every 6 (six) hours as needed. 15 tablet 0   triamterene -hydrochlorothiazide (DYAZIDE) 37.5-25 MG capsule Take 1 capsule by mouth every morning.     No current facility-administered medications on file prior to visit.  [2] No Known Allergies  "

## 2024-08-12 ENCOUNTER — Encounter: Payer: Self-pay | Admitting: Neurology

## 2024-08-12 ENCOUNTER — Ambulatory Visit (INDEPENDENT_AMBULATORY_CARE_PROVIDER_SITE_OTHER): Admitting: Neurology

## 2024-08-12 VITALS — BP 149/74 | HR 83 | Ht 61.0 in | Wt 138.2 lb

## 2024-08-12 DIAGNOSIS — I10 Essential (primary) hypertension: Secondary | ICD-10-CM

## 2024-08-12 DIAGNOSIS — E785 Hyperlipidemia, unspecified: Secondary | ICD-10-CM

## 2024-08-12 DIAGNOSIS — I4891 Unspecified atrial fibrillation: Secondary | ICD-10-CM | POA: Diagnosis not present

## 2024-08-12 DIAGNOSIS — I63431 Cerebral infarction due to embolism of right posterior cerebral artery: Secondary | ICD-10-CM | POA: Diagnosis not present

## 2024-09-11 ENCOUNTER — Ambulatory Visit: Admitting: Nurse Practitioner

## 2025-02-24 ENCOUNTER — Ambulatory Visit: Payer: Self-pay | Admitting: Neurology
